# Patient Record
Sex: Female | Born: 1985 | Race: Black or African American | Hispanic: No | Marital: Single | Smoking: Never smoker
Health system: Southern US, Community
[De-identification: ages and names within clinical notes are randomized; demographics above are authoritative.]

## PROBLEM LIST (undated history)

## (undated) DIAGNOSIS — F419 Anxiety disorder, unspecified: Secondary | ICD-10-CM

## (undated) HISTORY — DX: Anxiety disorder, unspecified: F41.9

---

## 2004-11-11 ENCOUNTER — Emergency Department (HOSPITAL_COMMUNITY): Admission: EM | Admit: 2004-11-11 | Discharge: 2004-11-11 | Payer: Self-pay | Admitting: Emergency Medicine

## 2014-05-30 ENCOUNTER — Other Ambulatory Visit: Payer: Self-pay | Admitting: Occupational Medicine

## 2014-05-30 ENCOUNTER — Ambulatory Visit: Payer: Self-pay

## 2014-05-30 DIAGNOSIS — M25561 Pain in right knee: Secondary | ICD-10-CM

## 2015-03-31 DIAGNOSIS — H5213 Myopia, bilateral: Secondary | ICD-10-CM | POA: Diagnosis not present

## 2016-06-14 DIAGNOSIS — H5213 Myopia, bilateral: Secondary | ICD-10-CM | POA: Diagnosis not present

## 2016-10-31 ENCOUNTER — Ambulatory Visit (INDEPENDENT_AMBULATORY_CARE_PROVIDER_SITE_OTHER): Payer: 59 | Admitting: Physician Assistant

## 2016-10-31 ENCOUNTER — Encounter: Payer: Self-pay | Admitting: Physician Assistant

## 2016-10-31 VITALS — BP 125/79 | HR 86 | Temp 99.0°F | Resp 16 | Ht 63.0 in | Wt 270.6 lb

## 2016-10-31 DIAGNOSIS — F418 Other specified anxiety disorders: Secondary | ICD-10-CM

## 2016-10-31 MED ORDER — HYDROXYZINE HCL 50 MG PO TABS: 50.0000 mg | ORAL_TABLET | Freq: Four times a day (QID) | ORAL | 1 refills | Status: DC | PRN
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
## 2016-10-31 NOTE — Patient Instructions (Addendum)
Hydroxyzine: Anxiety: Oral: Take 50 - 100 mg 4 times daily. Be sure to take a dose 30-60 minutes before bedtime.  Continue with your deep breathing techniques.  The "Calm" app has some great meditation, relaxation and breathing exercises.  Come back in 2-3 weeks if you are not experiencing improvement. We will start a different medication.   Thank you for coming in today. I hope you feel we met your needs.  Feel free to call PCP if you have any questions or further requests.  Please consider signing up for MyChart if you do not already have it, as this is a great way to communicate with me.  Best,  Whitney McVey, PA-C   How is this treated? The following therapies are usually used to treat:  Medicine. Antidepressant medicine is usually prescribed for long-term daily control.   Talk therapy (psychotherapy). Certain types of talk therapy can be helpful by providing support, education, and guidance. Options include: ? Cognitive behavioral therapy (CBT). People learn coping skills and techniques to ease their anxiety. They learn to identify unrealistic or negative thoughts and behaviors and to replace them with positive ones. ? Acceptance and commitment therapy (ACT). This treatment teaches people how to be mindful as a way to cope with unwanted thoughts and feelings. ? Biofeedback. This process trains you to manage your body's response (physiological response) through breathing techniques and relaxation methods. You will work with a therapist while machines are used to monitor your physical symptoms.  Stress management techniques. These include yoga, meditation, and exercise.  A mental health specialist can help determine which treatment is best for you. Some people see improvement with one type of therapy. However, other people require a combination of therapies. Follow these instructions at home:  Take over-the-counter and prescription medicines only as told by your health care  provider.  Try to maintain a normal routine.  Try to anticipate stressful situations and allow extra time to manage them.  Practice any stress management or self-calming techniques as taught by your health care provider.  Do not punish yourself for setbacks or for not making progress.  Try to recognize your accomplishments, even if they are small.  Keep all follow-up visits as told by your health care provider. This is important. Get help right away if:  You have serious thoughts about hurting yourself or others. If you ever feel like you may hurt yourself or others, or have thoughts about taking your own life, get help right away. You can go to your nearest emergency department or call:  Your local emergency services (911 in the [place]  A suicide crisis helpline, such as the [facility] at 231-258-7027. This is open 24 hours a day.   IF you received an x-ray today, you will receive an invoice from [facility]. Please contact [facility] at [phone] with questions or concerns regarding your invoice.   IF you received labwork today, you will receive an invoice from [facility]. Please contact [facility] at [phone] with questions or concerns regarding your invoice.   Our billing staff will not be able to assist you with questions regarding bills from these companies.  You will be contacted with the lab results as soon as they are available. The fastest way to get your results is to activate your My Chart account. Instructions are located on the last page of this paperwork. If you have not heard from [place] regarding the results in 2 weeks, please contact this office.

## 2017-04-16 ENCOUNTER — Encounter: Payer: Self-pay | Admitting: Nurse Practitioner

## 2017-04-16 ENCOUNTER — Ambulatory Visit (INDEPENDENT_AMBULATORY_CARE_PROVIDER_SITE_OTHER): Payer: No Typology Code available for payment source | Admitting: Nurse Practitioner

## 2017-04-16 VITALS — BP 130/80 | HR 76 | Temp 98.0°F | Ht 63.0 in | Wt 269.0 lb

## 2017-04-16 DIAGNOSIS — Z0001 Encounter for general adult medical examination with abnormal findings: Secondary | ICD-10-CM | POA: Diagnosis not present

## 2017-04-16 DIAGNOSIS — Z13220 Encounter for screening for lipoid disorders: Secondary | ICD-10-CM

## 2017-04-16 DIAGNOSIS — Z114 Encounter for screening for human immunodeficiency virus [HIV]: Secondary | ICD-10-CM

## 2017-04-16 DIAGNOSIS — M25461 Effusion, right knee: Secondary | ICD-10-CM

## 2017-04-16 DIAGNOSIS — Z136 Encounter for screening for cardiovascular disorders: Secondary | ICD-10-CM | POA: Diagnosis not present

## 2017-04-16 NOTE — Patient Instructions (Signed)
Please go lab for blood draw.  Bring last PAP smear report to next office visit.  Health Maintenance, Female Adopting a healthy lifestyle and getting preventive care can go a long way to promote health and wellness. Talk with your health care provider about what schedule of regular examinations is right for you. This is a good chance for you to check in with your provider about disease prevention and staying healthy. In between checkups, there are plenty of things you can do on your own. Experts have done a lot of research about which lifestyle changes and preventive measures are most likely to keep you healthy. Ask your health care provider for more information. Weight and diet Eat a healthy diet  Be sure to include plenty of vegetables, fruits, low-fat dairy products, and lean protein.  Do not eat a lot of foods high in solid fats, added sugars, or salt.  Get regular exercise. This is one of the most important things you can do for your health. ? Most adults should exercise for at least 150 minutes each week. The exercise should increase your heart rate and make you sweat (moderate-intensity exercise). ? Most adults should also do strengthening exercises at least twice a week. This is in addition to the moderate-intensity exercise.  Maintain a healthy weight  Body mass index (BMI) is a measurement that can be used to identify possible weight problems. It estimates body fat based on height and weight. Your health care provider can help determine your BMI and help you achieve or maintain a healthy weight.  For females 64 years of age and older: ? A BMI below 18.5 is considered underweight. ? A BMI of 18.5 to 24.9 is normal. ? A BMI of 25 to 29.9 is considered overweight. ? A BMI of 30 and above is considered obese.  Watch levels of cholesterol and blood lipids  You should start having your blood tested for lipids and cholesterol at 34 years of age, then have this test every 5  years.  You may need to have your cholesterol levels checked more often if: ? Your lipid or cholesterol levels are high. ? You are older than 34 years of age. ? You are at high risk for heart disease.  Cancer screening Lung Cancer  Lung cancer screening is recommended for adults 53-61 years old who are at high risk for lung cancer because of a history of smoking.  A yearly low-dose CT scan of the lungs is recommended for people who: ? Currently smoke. ? Have quit within the past 15 years. ? Have at least a 30-pack-year history of smoking. A pack year is smoking an average of one pack of cigarettes a day for 1 year.  Yearly screening should continue until it has been 15 years since you quit.  Yearly screening should stop if you develop a health problem that would prevent you from having lung cancer treatment.  Breast Cancer  Practice breast self-awareness. This means understanding how your breasts normally appear and feel.  It also means doing regular breast self-exams. Let your health care provider know about any changes, no matter how small.  If you are in your 20s or 30s, you should have a clinical breast exam (CBE) by a health care provider every 1-3 years as part of a regular health exam.  If you are 69 or older, have a CBE every year. Also consider having a breast X-ray (mammogram) every year.  If you have a family history of breast  cancer, talk to your health care provider about genetic screening.  If you are at high risk for breast cancer, talk to your health care provider about having an MRI and a mammogram every year.  Breast cancer gene (BRCA) assessment is recommended for women who have family members with BRCA-related cancers. BRCA-related cancers include: ? Breast. ? Ovarian. ? Tubal. ? Peritoneal cancers.  Results of the assessment will determine the need for genetic counseling and BRCA1 and BRCA2 testing.  Cervical Cancer Your health care provider may  recommend that you be screened regularly for cancer of the pelvic organs (ovaries, uterus, and vagina). This screening involves a pelvic examination, including checking for microscopic changes to the surface of your cervix (Pap test). You may be encouraged to have this screening done every 3 years, beginning at age 20.  For women ages 45-65, health care providers may recommend pelvic exams and Pap testing every 3 years, or they may recommend the Pap and pelvic exam, combined with testing for human papilloma virus (HPV), every 5 years. Some types of HPV increase your risk of cervical cancer. Testing for HPV may also be done on women of any age with unclear Pap test results.  Other health care providers may not recommend any screening for nonpregnant women who are considered low risk for pelvic cancer and who do not have symptoms. Ask your health care provider if a screening pelvic exam is right for you.  If you have had past treatment for cervical cancer or a condition that could lead to cancer, you need Pap tests and screening for cancer for at least 20 years after your treatment. If Pap tests have been discontinued, your risk factors (such as having a new sexual partner) need to be reassessed to determine if screening should resume. Some women have medical problems that increase the chance of getting cervical cancer. In these cases, your health care provider may recommend more frequent screening and Pap tests.  Colorectal Cancer  This type of cancer can be detected and often prevented.  Routine colorectal cancer screening usually begins at 34 years of age and continues through 34 years of age.  Your health care provider may recommend screening at an earlier age if you have risk factors for colon cancer.  Your health care provider may also recommend using home test kits to check for hidden blood in the stool.  A small camera at the end of a tube can be used to examine your colon directly  (sigmoidoscopy or colonoscopy). This is done to check for the earliest forms of colorectal cancer.  Routine screening usually begins at age 59.  Direct examination of the colon should be repeated every 5-10 years through 34 years of age. However, you may need to be screened more often if early forms of precancerous polyps or small growths are found.  Skin Cancer  Check your skin from head to toe regularly.  Tell your health care provider about any new moles or changes in moles, especially if there is a change in a mole's shape or color.  Also tell your health care provider if you have a mole that is larger than the size of a pencil eraser.  Always use sunscreen. Apply sunscreen liberally and repeatedly throughout the day.  Protect yourself by wearing long sleeves, pants, a wide-brimmed hat, and sunglasses whenever you are outside.  Heart disease, diabetes, and high blood pressure  High blood pressure causes heart disease and increases the risk of stroke. High blood pressure  is more likely to develop in: ? People who have blood pressure in the high end of the normal range (130-139/85-89 mm Hg). ? People who are overweight or obese. ? People who are African American.  If you are 38-40 years of age, have your blood pressure checked every 3-5 years. If you are 62 years of age or older, have your blood pressure checked every year. You should have your blood pressure measured twice-once when you are at a hospital or clinic, and once when you are not at a hospital or clinic. Record the average of the two measurements. To check your blood pressure when you are not at a hospital or clinic, you can use: ? An automated blood pressure machine at a pharmacy. ? A home blood pressure monitor.  If you are between 51 years and 54 years old, ask your health care provider if you should take aspirin to prevent strokes.  Have regular diabetes screenings. This involves taking a blood sample to check your  fasting blood sugar level. ? If you are at a normal weight and have a low risk for diabetes, have this test once every three years after 34 years of age. ? If you are overweight and have a high risk for diabetes, consider being tested at a younger age or more often. Preventing infection Hepatitis B  If you have a higher risk for hepatitis B, you should be screened for this virus. You are considered at high risk for hepatitis B if: ? You were born in a country where hepatitis B is common. Ask your health care provider which countries are considered high risk. ? Your parents were born in a high-risk country, and you have not been immunized against hepatitis B (hepatitis B vaccine). ? You have HIV or AIDS. ? You use needles to inject street drugs. ? You live with someone who has hepatitis B. ? You have had sex with someone who has hepatitis B. ? You get hemodialysis treatment. ? You take certain medicines for conditions, including cancer, organ transplantation, and autoimmune conditions.  Hepatitis C  Blood testing is recommended for: ? Everyone born from [birth year] through 1967. ? Anyone with known risk factors for hepatitis C.  Sexually transmitted infections (STIs)  You should be screened for sexually transmitted infections (STIs) including gonorrhea and chlamydia if: ? You are sexually active and are younger than 34 years of age. ? You are older than 34 years of age and your health care provider tells you that you are at risk for this type of infection. ? Your sexual activity has changed since you were last screened and you are at an increased risk for chlamydia or gonorrhea. Ask your health care provider if you are at risk.  If you do not have HIV, but are at risk, it may be recommended that you take a prescription medicine daily to prevent HIV infection. This is called pre-exposure prophylaxis (PrEP). You are considered at risk if: ? You are sexually active and do not regularly use condoms  or know the HIV status of your partner(s). ? You take drugs by injection. ? You are sexually active with a partner who has HIV.  Talk with your health care provider about whether you are at high risk of being infected with HIV. If you choose to begin PrEP, you should first be tested for HIV. You should then be tested every 3 months for as long as you are taking PrEP. Pregnancy  If you are premenopausal and  you may become pregnant, ask your health care provider about preconception counseling.  If you may become pregnant, take 400 to 800 micrograms (mcg) of folic acid every day.  If you want to prevent pregnancy, talk to your health care provider about birth control (contraception). Osteoporosis and menopause  Osteoporosis is a disease in which the bones lose minerals and strength with aging. This can result in serious bone fractures. Your risk for osteoporosis can be identified using a bone density scan.  If you are 23 years of age or older, or if you are at risk for osteoporosis and fractures, ask your health care provider if you should be screened.  Ask your health care provider whether you should take a calcium or vitamin D supplement to lower your risk for osteoporosis.  Menopause may have certain physical symptoms and risks.  Hormone replacement therapy may reduce some of these symptoms and risks. Talk to your health care provider about whether hormone replacement therapy is right for you. Follow these instructions at home:  Schedule regular health, dental, and eye exams.  Stay current with your immunizations.  Do not use any tobacco products including cigarettes, chewing tobacco, or electronic cigarettes.  If you are pregnant, do not drink alcohol.  If you are breastfeeding, limit how much and how often you drink alcohol.  Limit alcohol intake to no more than 1 drink per day for nonpregnant women. One drink equals 12 ounces of beer, 5 ounces of wine, or 1 ounces of hard  liquor.  Do not use street drugs.  Do not share needles.  Ask your health care provider for help if you need support or information about quitting drugs.  Tell your health care provider if you often feel depressed.  Tell your health care provider if you have ever been abused or do not feel safe at home. This information is not intended to replace advice given to you by your health care provider. Make sure you discuss any questions you have with your health care provider. Document Released: 09/16/2010 Document Revised: 08/09/2015 Document Reviewed: 12/05/2014 Elsevier Interactive Patient Education  [employer].

## 2017-04-16 NOTE — Progress Notes (Signed)
Subjective:    Patient ID: Kiara SkinnerRenee M Romero, female    DOB: 1986-03-02, 32 y.o.   MRN: [id number]  Patient presents today for complete physical   Knee Pain   Incident onset: fall on knee 1year ago. The incident occurred at work. The injury mechanism was a fall. The pain is present in the right knee. The quality of the pain is described as aching. The pain has been intermittent since onset. Pertinent negatives include no inability to bear weight, loss of motion, loss of sensation, muscle weakness, numbness or tingling. Associated symptoms comments: Swelling and instability. The symptoms are aggravated by movement. She has tried ice and rest for the symptoms. The treatment provided no relief.   Immunizations: (TDAP, Hep C screen, Pneumovax, Influenza, zoster)  Health Maintenance  Topic Date Due  . HIV Screening  08/14/1998  . Pap Smear  03/17/2017  . Tetanus Vaccine  03/18/2023  . Flu Shot  Completed   Diet: portion control.  Weight:  Wt Readings from Last 3 Encounters:  04/16/17 269 lb (122 kg)  10/31/16 270 lb 9.6 oz (122.7 kg)   Exercise:cardio/dance class daily.  Fall Risk: Fall Risk  04/16/2017 10/31/2016  Falls in the past year? Yes No  Number falls in past yr: 1 -  Injury with Fall? Yes -  Comment knee injury -   Home Safety:home with husband and child.  Depression/Suicide: [facility] 04/16/2017 10/31/2016  Decreased Interest 0 0  Down, Depressed, Hopeless 0 0  PHQ - 2 Score 0 0   Pap Smear (every 6547yrs for >21-29 without HPV, every 2380yrs for >30-31680yrs with HPV):last done 2016, unknwn provider, no hx of abnormal  Vision:up to date, [facility]  Dental:needed.  Sexual History (birth control, marital status, STD):sexually active, no contraceptive.  Medications and allergies reviewed with patient and updated if appropriate.  There are no active problems to display for this patient.   Current Outpatient Medications on File Prior to Visit    Medication Sig Dispense Refill  . hydrOXYzine (ATARAX/VISTARIL) 50 MG tablet Take 1 tablet (50 mg total) by mouth every 6 (six) hours as needed for anxiety (may increase to 100mg  q 6 hrs if needed). (Patient not taking: Reported on 04/16/2017) 60 tablet 1   No current facility-administered medications on file prior to visit.     Past Medical History:  Diagnosis Date  . Anxiety     Past Surgical History:  Procedure Laterality Date  . CESAREAN SECTION  2005    Social History   Socioeconomic History  . Marital status: Married    Spouse name: None  . Number of children: None  . Years of education: None  . Highest education level: None  Social Needs  . Financial resource strain: None  . Food insecurity - worry: None  . Food insecurity - inability: None  . Transportation needs - medical: None  . Transportation needs - non-medical: None  Occupational History  . None  Tobacco Use  . Smoking status: Never Smoker  . Smokeless tobacco: Never Used  Substance and Sexual Activity  . Alcohol use: No  . Drug use: No  . Sexual activity: None  Other Topics Concern  . None  Social History Narrative  . None    Family History  Problem Relation Age of Onset  . Diabetes Mother   . Hyperlipidemia Mother   . Hypertension Mother   . Depression Mother   . Hyperlipidemia Father   .  Heart disease Father   . Drug abuse Father   . Depression Brother   . Hypertension Brother   . Anxiety disorder Brother         Review of Systems  Constitutional: Negative for fever, malaise/fatigue and weight loss.  HENT: Negative for congestion and sore throat.   Eyes:       Negative for visual changes  Respiratory: Negative for cough and shortness of breath.   Cardiovascular: Negative for chest pain, palpitations and leg swelling.  Gastrointestinal: Negative for blood in stool, constipation, diarrhea and heartburn.  Genitourinary: Negative for dysuria, frequency and urgency.  Musculoskeletal:  Positive for joint pain. Negative for falls and myalgias.  Skin: Negative for rash.  Neurological: Negative for dizziness, tingling, sensory change, numbness and headaches.  Endo/Heme/Allergies: Does not bruise/bleed easily.  Psychiatric/Behavioral: Negative for depression, substance abuse and suicidal ideas. The patient is not nervous/anxious.     Objective:   Vitals:   04/16/17 1538  BP: 130/80  Pulse: 76  Temp: 98 F (36.7 C)  SpO2: 98%    Body mass index is 47.65 kg/m.   Physical Examination:  Physical Exam  Constitutional: She is oriented to person, place, and time and well-developed, well-nourished, and in no distress. No distress.  HENT:  Right Ear: External ear normal.  Left Ear: External ear normal.  Nose: Nose normal.  Mouth/Throat: Oropharynx is clear and moist. No oropharyngeal exudate.  Eyes: Conjunctivae and EOM are normal. Pupils are equal, round, and reactive to light. No scleral icterus.  Neck: Normal range of motion. Neck supple. No thyromegaly present.  Cardiovascular: Normal rate, normal heart sounds and intact distal pulses.  Pulmonary/Chest: Effort normal and breath sounds normal. She exhibits no tenderness. Right breast exhibits no inverted nipple, no mass, no nipple discharge, no skin change and no tenderness. Left breast exhibits no inverted nipple, no mass, no nipple discharge, no skin change and no tenderness. Breasts are symmetrical.  Abdominal: Soft. Bowel sounds are normal. She exhibits no distension. There is no tenderness.  Genitourinary:  Genitourinary Comments: Deferred by patient  Musculoskeletal: Normal range of motion. She exhibits no edema or tenderness.       Right knee: She exhibits effusion. She exhibits normal range of motion and no erythema. No tenderness found.       Left knee: Normal.  Right knee crepitus  Lymphadenopathy:    She has no cervical adenopathy.  Neurological: She is alert and oriented to person, place, and time.  Gait normal.  Skin: Skin is warm and dry.  Psychiatric: Affect and judgment normal.    ASSESSMENT and PLAN:  Kiara Romero was seen today for establish care.  Diagnoses and all orders for this visit:  Encounter for preventative adult health care exam with abnormal findings -     Cancel: CBC -     Cancel: Comprehensive metabolic panel -     Cancel: Lipid panel -     Cancel: TSH -     Cancel: HIV antibody -     Comprehensive metabolic panel; Future -     Lipid panel; Future -     TSH; Future  Encounter for lipid screening for cardiovascular disease -     Cancel: Lipid panel -     CBC; Future  Encounter for screening for human immunodeficiency virus (HIV) -     Cancel: HIV antibody -     HIV antibody; Future  Knee effusion, right -     Ambulatory referral to Sports Medicine  No problem-specific Assessment & Plan notes found for this encounter.     Follow up: Return if symptoms worsen or fail to improve.  Alysia Penna, NP

## 2017-04-17 ENCOUNTER — Encounter: Payer: Self-pay | Admitting: Nurse Practitioner

## 2017-04-24 ENCOUNTER — Ambulatory Visit: Payer: No Typology Code available for payment source | Admitting: Family Medicine

## 2017-05-15 ENCOUNTER — Ambulatory Visit (INDEPENDENT_AMBULATORY_CARE_PROVIDER_SITE_OTHER): Payer: No Typology Code available for payment source | Admitting: Family Medicine

## 2017-05-15 ENCOUNTER — Encounter: Payer: Self-pay | Admitting: Family Medicine

## 2017-05-15 VITALS — BP 134/76 | HR 83 | Temp 98.5°F | Ht 63.0 in | Wt 263.0 lb

## 2017-05-15 DIAGNOSIS — M25561 Pain in right knee: Secondary | ICD-10-CM | POA: Diagnosis not present

## 2017-05-15 MED ORDER — DICLOFENAC SODIUM 2 % TD SOLN: 1.0000 "application " | Freq: Two times a day (BID) | TRANSDERMAL | 3 refills | Status: DC

## 2017-05-15 NOTE — Patient Instructions (Addendum)
Please try to wear compression Please try to ice as needed.  Please try the exercises I've provided.  Please try riding a bike for exercise Please follow up with me in 4-6 weeks if there is no improvement.

## 2017-05-15 NOTE — Progress Notes (Signed)
Kiara SkinnerRenee M Warren - 32 y.o. female MRN [id number]  Date of birth: [birth year]  SUBJECTIVE:  Including CC & ROS.  Chief Complaint  Patient presents with  . Right knee pain    Kiara SkinnerRenee M Warren is a 32 y.o. female that is presenting with right knee pain. Pain has been increasing over the past month. She sustained a fall at work and landed on her knee last year. Admits to swelling and tenderness. Pain located anteriorly.Denies tingling or numbness. Pain is a burning ache. Flexion and extension increase the pain. She has been taking motrin and applying icy hot. She has been increasing her exercising daily. Pain is anterior and localized. Pain is worse with prolonged sitting and having to rise from a seated position.       Review of Systems  Constitutional: Negative for fever.  HENT: Negative for sore throat.   Respiratory: Negative for cough.   Cardiovascular: Negative for chest pain.  Gastrointestinal: Negative for abdominal pain.  Musculoskeletal: Negative for gait problem.  Skin: Negative for color change.  Allergic/Immunologic: Negative for immunocompromised state.  Neurological: Negative for weakness.  Hematological: Negative for adenopathy.  Psychiatric/Behavioral: Negative for agitation.    HISTORY: Past Medical, Surgical, Social, and Family History Reviewed & Updated per EMR.   Pertinent Historical Findings include:  Past Medical History:  Diagnosis Date  . Anxiety     Past Surgical History:  Procedure Laterality Date  . CESAREAN SECTION  2005    No Known Allergies  Family History  Problem Relation Age of Onset  . Diabetes Mother   . Hyperlipidemia Mother   . Hypertension Mother   . Depression Mother   . Hyperlipidemia Father   . Heart disease Father   . Drug abuse Father   . Depression Brother   . Hypertension Brother   . Anxiety disorder Brother      Social History   Socioeconomic History  . Marital status: Single    Spouse name: Not on file  . Number of  children: Not on file  . Years of education: Not on file  . Highest education level: Not on file  Social Needs  . Financial resource strain: Not on file  . Food insecurity - worry: Not on file  . Food insecurity - inability: Not on file  . Transportation needs - medical: Not on file  . Transportation needs - non-medical: Not on file  Occupational History  . Not on file  Tobacco Use  . Smoking status: Never Smoker  . Smokeless tobacco: Never Used  Substance and Sexual Activity  . Alcohol use: No  . Drug use: No  . Sexual activity: Not on file  Other Topics Concern  . Not on file  Social History Narrative  . Not on file     PHYSICAL EXAM:  VS: BP 134/76 (BP Location: Left Arm, Patient Position: Sitting, Cuff Size: Normal)   Pulse 83   Temp 98.5 F (36.9 C) (Oral)   Ht 5\' 3"  (1.6 m)   Wt 263 lb (119.3 kg)   SpO2 98%   BMI 46.59 kg/m  Physical Exam Gen: NAD, alert, cooperative with exam, well-appearing ENT: normal lips, normal nasal mucosa,  Eye: normal EOM, normal conjunctiva and lids CV:  no edema, +2 pedal pulses   Resp: no accessory muscle use, non-labored,  GI: no masses or tenderness, no hernia  Skin: no rashes, no areas of induration  Neuro: normal tone, normal sensation to touch Psych:  normal insight, alert and  oriented MSK:  No effusion  No TTP of the medial or lateral joint line.  Normal ROM  Normal strength to resistance in flexion and extension  No instability with valgus and varus testing  Normal Thessaly test  Good strength with hip abduction Neurovascularly intact   Limited ultrasound: right knee:  No effusion in the SPP  Normal QT and PT in long axis Normal medial joint line  Normal lateral joint line  Normal joint space in the sunrise view  Summary: normal exam    Ultrasound and interpretation by Clare Gandy, MD       ASSESSMENT & PLAN:   Acute pain of right knee Likely related to patellofemoral pain. No suggestion of  meniscal injury. Possible for fat pad impingement as she states pain with complete extension.  - Pennsaid  - Counseled on HEP  - counseled on ice and compression  - if no improvement would xray and consider PT.

## 2017-05-15 NOTE — Assessment & Plan Note (Signed)
Likely related to patellofemoral pain. No suggestion of meniscal injury. Possible for fat pad impingement as she states pain with complete extension.  - Pennsaid  - Counseled on HEP  - counseled on ice and compression  - if no improvement would xray and consider PT.

## 2017-09-08 ENCOUNTER — Telehealth: Payer: No Typology Code available for payment source | Admitting: Family

## 2017-09-08 DIAGNOSIS — R197 Diarrhea, unspecified: Secondary | ICD-10-CM

## 2017-09-08 NOTE — Progress Notes (Signed)
Based on what you shared with me it looks like you have a condition that should be evaluated in a face to face office visit.  NOTE: If you entered your credit card information for this eVisit, you will not be charged. You may see a "hold" on your card for the $30 but that hold will drop off and you will not have a charge processed.  If you are having a true medical emergency please call 911.  If you need an urgent face to face visit, [place] has four urgent care centers for your convenience.  If you need care fast and have a high deductible or no insurance consider:   [email or web address] to reserve your spot online an avoid wait times  [facility] [address], [address] [place], [place] [address] 8 am to 8 pm Monday-Friday 10 am to 4 pm Saturday-Sunday *Across the street from [employer]  [facility]  [address] [place], [address] 8 am to 5 pm Monday-Friday * In the [facility]   The following sites will take your  insurance:  . [facility]  [phone] Get Driving Directions Find a Provider at this Location  [address] [place], [place] [address] . 10 am to 8 pm Monday-Friday . 12 pm to 8 pm Saturday-Sunday   . [facility]  [phone] Get Driving Directions Find a Provider at this Location  [address], [address] [place], [place] [address] . 8 am to 8 pm Monday-Friday . 9 am to 6 pm Saturday . 11 am to 6 pm Sunday   . [facility] [place]  [phone] Get Driving Directions  [address] [place], [place] [address] . 8 am to 8 pm Monday-Friday . 8 am to 4 pm Saturday-Sunday   Your e-visit answers were reviewed by a board certified advanced clinical practitioner to complete your personal care plan.  Thank you for using e-Visits. 

## 2018-01-14 ENCOUNTER — Encounter: Payer: Self-pay | Admitting: Nurse Practitioner

## 2018-03-24 ENCOUNTER — Telehealth: Payer: No Typology Code available for payment source | Admitting: Nurse Practitioner

## 2018-03-24 DIAGNOSIS — J069 Acute upper respiratory infection, unspecified: Secondary | ICD-10-CM

## 2018-03-24 MED ORDER — BENZONATATE 100 MG PO CAPS: 100.0000 mg | ORAL_CAPSULE | Freq: Three times a day (TID) | ORAL | 0 refills | Status: DC | PRN

## 2018-03-24 MED ORDER — AZITHROMYCIN 250 MG PO TABS: ORAL_TABLET | ORAL | 0 refills | Status: DC

## 2018-03-24 NOTE — Progress Notes (Signed)

## 2018-04-16 ENCOUNTER — Telehealth: Payer: No Typology Code available for payment source | Admitting: Nurse Practitioner

## 2018-04-16 DIAGNOSIS — J069 Acute upper respiratory infection, unspecified: Secondary | ICD-10-CM | POA: Diagnosis not present

## 2018-04-16 MED ORDER — BENZONATATE 100 MG PO CAPS: 100.0000 mg | ORAL_CAPSULE | Freq: Three times a day (TID) | ORAL | 0 refills | Status: DC | PRN

## 2018-04-16 MED ORDER — FLUTICASONE PROPIONATE 50 MCG/ACT NA SUSP: 2.0000 | Freq: Every day | NASAL | 6 refills | Status: DC

## 2018-04-16 NOTE — Progress Notes (Signed)
We are sorry you are not feeling well.  Here is how we plan to help!  Based on what you have shared with me, it looks like you may have a viral upper respiratory infection or a "common cold".  Colds are caused by a large number of viruses; however, rhinovirus is the most common cause.   Symptoms of the common cold vary from person to person, with common symptoms including sore throat, cough, and malaise.  A low-grade fever of 100.4 may present, but is often uncommon.  Symptoms vary however, and are closely related to a person's age or underlying illnesses.  The most common symptoms associated with the common cold are nasal discharge or congestion, cough, sneezing, headache and pressure in the ears and face.  Cold symptoms usually persist for about 3 to 10 days, but can last up to 2 weeks.  It is important to know that colds do not cause serious illness or complications in most cases.    The common cold is transmitted from person to person, with the most common method of transmission being a person's hands.  The virus is able to live on the skin and can infect other persons for up to 2 hours after direct contact.  Also, colds are transmitted when someone coughs or sneezes; thus, it is important to cover the mouth to reduce this risk.  To keep the spread of the common cold at bay, good hand hygiene is very important.  This is an infection that is most likely caused by a virus. There are no specific treatments for the common cold other than to help you with the symptoms until the infection runs its course.    For nasal congestion, you may use an oral decongestants such as Mucinex D or if you have glaucoma or high blood pressure use plain Mucinex.  Saline nasal spray or nasal drops can help and can safely be used as often as needed for congestion.  For your congestion, I have prescribed Fluticasone nasal spray one spray in each nostril twice a day  If you do not have a history of heart disease, hypertension,  diabetes or thyroid disease, prostate/bladder issues or glaucoma, you may also use Sudafed to treat nasal congestion.  It is highly recommended that you consult with a pharmacist or your primary care physician to ensure this medication is safe for you to take.     If you have a cough, you may use cough suppressants such as Delsym and Robitussin.  If you have glaucoma or high blood pressure, you can also use Coricidin HBP.   For cough I have prescribed for you A prescription cough medication called Tessalon Perles 100 mg. You may take 1-2 capsules every 8 hours as needed for cough  If you have a sore or scratchy throat, use a saltwater gargle-  to  teaspoon of salt dissolved in a 4-ounce to 8-ounce glass of warm water.  Gargle the solution for approximately 15-30 seconds and then spit.  It is important not to swallow the solution.  You can also use throat lozenges/cough drops and Chloraseptic spray to help with throat pain or discomfort.  Warm or cold liquids can also be helpful in relieving throat pain.  For headache, pain or general discomfort, you can use Ibuprofen or Tylenol as directed.   Some authorities believe that zinc sprays or the use of Echinacea may shorten the course of your symptoms.   HOME CARE . Only take medications as instructed by your   medical team. . Be sure to drink plenty of fluids. Water is fine as well as fruit juices, sodas and electrolyte beverages. You may want to stay away from caffeine or alcohol. If you are nauseated, try taking small sips of liquids. How do you know if you are getting enough fluid? Your urine should be a pale yellow or almost colorless. . Get rest. . Taking a steamy shower or using a humidifier may help nasal congestion and ease sore throat pain. You can place a towel over your head and breathe in the steam from hot water coming from a faucet. . Using a saline nasal spray works much the same way. . Cough drops, hard candies and sore throat lozenges  may ease your cough. . Avoid close contacts especially the very young and the elderly . Cover your mouth if you cough or sneeze . Always remember to wash your hands.   GET HELP RIGHT AWAY IF: . You develop worsening fever. . If your symptoms do not improve within 10 days . You develop yellow or green discharge from your nose over 3 days. . You have coughing fits . You develop a severe head ache or visual changes. . You develop shortness of breath, difficulty breathing or start having chest pain . Your symptoms persist after you have completed your treatment plan  MAKE SURE YOU   Understand these instructions.  Will watch your condition.  Will get help right away if you are not doing well or get worse.  Your e-visit answers were reviewed by a board certified advanced clinical practitioner to complete your personal care plan. Depending upon the condition, your plan could have included both over the counter or prescription medications. Please review your pharmacy choice. If there is a problem, you may call our nursing hot line at and have the prescription routed to another pharmacy. Your safety is important to us. If you have drug allergies check your prescription carefully.   You can use MyChart to ask questions about today's visit, request a non-urgent call back, or ask for a work or school excuse for 24 hours related to this e-Visit. If it has been greater than 24 hours you will need to follow up with your provider, or enter a new e-Visit to address those concerns. You will get an e-mail in the next two days asking about your experience.  I hope that your e-visit has been valuable and will speed your recovery. Thank you for using e-visits.      

## 2018-04-28 ENCOUNTER — Ambulatory Visit (INDEPENDENT_AMBULATORY_CARE_PROVIDER_SITE_OTHER): Payer: No Typology Code available for payment source | Admitting: Nurse Practitioner

## 2018-04-28 ENCOUNTER — Encounter: Payer: Self-pay | Admitting: Nurse Practitioner

## 2018-04-28 ENCOUNTER — Ambulatory Visit (INDEPENDENT_AMBULATORY_CARE_PROVIDER_SITE_OTHER): Payer: No Typology Code available for payment source

## 2018-04-28 VITALS — BP 134/84 | HR 94 | Temp 98.5°F | Ht 63.0 in | Wt 256.0 lb

## 2018-04-28 DIAGNOSIS — R05 Cough: Secondary | ICD-10-CM

## 2018-04-28 DIAGNOSIS — Z7251 High risk heterosexual behavior: Secondary | ICD-10-CM | POA: Diagnosis not present

## 2018-04-28 DIAGNOSIS — R059 Cough, unspecified: Secondary | ICD-10-CM

## 2018-04-28 DIAGNOSIS — R0602 Shortness of breath: Secondary | ICD-10-CM

## 2018-04-28 LAB — POCT URINE PREGNANCY: Preg Test, Ur: NEGATIVE

## 2018-04-28 MED ORDER — CETIRIZINE HCL 10 MG PO TABS: 10.0000 mg | ORAL_TABLET | Freq: Every day | ORAL | 0 refills | Status: DC

## 2018-04-28 MED ORDER — FLUTICASONE PROPIONATE 50 MCG/ACT NA SUSP: 2.0000 | Freq: Every day | NASAL | 6 refills | Status: DC

## 2018-04-28 MED ORDER — OMEPRAZOLE 20 MG PO CPDR: 20.0000 mg | DELAYED_RELEASE_CAPSULE | Freq: Every day | ORAL | 0 refills | Status: DC

## 2018-04-28 NOTE — Patient Instructions (Addendum)
Normal CXR. Start flonase, omeprazole and zyrtec

## 2018-04-28 NOTE — Progress Notes (Signed)
Subjective:  Patient ID: Kiara Romero, female    DOB: [birth year]  Age: 33 y.o. MRN: [id number]  CC: Cough (pt is c/o of coughing,SOB,tight in chest/ going on 1 wk ago/took otc cold med,mucinex DM,allergy med/ FYI--has E vitis 3 wks about--got abx and cough med. )  Cough  This is a new problem. The current episode started 1 to 4 weeks ago. The problem has been waxing and waning. The cough is non-productive. Associated symptoms include nasal congestion, postnasal drip, rhinorrhea, a sore throat and shortness of breath. Pertinent negatives include no chest pain, chills, fever, sweats, weight loss or wheezing. The symptoms are aggravated by cold air and lying down. She has tried a beta-agonist inhaler, OTC cough suppressant and prescription cough suppressant for the symptoms. The treatment provided mild relief. Her past medical history is significant for bronchitis.    Reviewed past Medical, Social and Family history today.  Outpatient Medications Prior to Visit  Medication Sig Dispense Refill  . azithromycin (ZITHROMAX Z-PAK) 250 MG tablet As directed (Patient not taking: Reported on 04/28/2018) 6 tablet 0  . benzonatate (TESSALON PERLES) 100 MG capsule Take 1 capsule (100 mg total) by mouth 3 (three) times daily as needed for cough. (Patient not taking: Reported on 04/28/2018) 20 capsule 0  . Diclofenac Sodium (PENNSAID) 2 % SOLN Place 1 application onto the skin 2 (two) times daily. (Patient not taking: Reported on 04/28/2018) 1 Bottle 3  . fluticasone (FLONASE) 50 MCG/ACT nasal spray Place 2 sprays into both nostrils daily. (Patient not taking: Reported on 04/28/2018) 16 g 6   No facility-administered medications prior to visit.     ROS See HPI  Objective:  BP 134/84   Pulse 94   Temp 98.5 F (36.9 C) (Oral)   Ht 5\' 3"  (1.6 m)   Wt 256 lb (116.1 kg)   SpO2 99%   BMI 45.35 kg/m   BP Readings from Last 3 Encounters:  04/28/18 134/84  05/15/17 134/76  04/16/17 130/80    Wt  Readings from Last 3 Encounters:  04/28/18 256 lb (116.1 kg)  05/15/17 263 lb (119.3 kg)  04/16/17 269 lb (122 kg)    Physical Exam Vitals signs reviewed.  Constitutional:      General: She is not in acute distress. HENT:     Right Ear: Tympanic membrane, ear canal and external ear normal.     Left Ear: Tympanic membrane, ear canal and external ear normal.     Mouth/Throat:     Mouth: Mucous membranes are moist.     Pharynx: Posterior oropharyngeal erythema present.  Cardiovascular:     Rate and Rhythm: Normal rate and regular rhythm.  Pulmonary:     Effort: Pulmonary effort is normal.     Breath sounds: Normal breath sounds.  Musculoskeletal:     Right lower leg: No edema.     Left lower leg: No edema.  Lymphadenopathy:     Cervical: No cervical adenopathy.  Neurological:     Mental Status: She is alert and oriented to person, place, and time.     No results found for: WBC, HGB, HCT, PLT, GLUCOSE, CHOL, TRIG, HDL, LDLDIRECT, LDLCALC, ALT, AST, NA, K, CL, CREATININE, BUN, CO2, TSH, PSA, INR, GLUF, HGBA1C, MICROALBUR  Assessment & Plan:   Luster LandsbergRenee was seen today for cough.  Diagnoses and all orders for this visit:  Cough -     DG Chest 2 View -     fluticasone (FLONASE) 50 MCG/ACT nasal  spray; Place 2 sprays into both nostrils daily. -     cetirizine (ZYRTEC) 10 MG tablet; Take 1 tablet (10 mg total) by mouth at bedtime. -     omeprazole (PRILOSEC) 20 MG capsule; Take 1 capsule (20 mg total) by mouth daily.  Unprotected sexual intercourse -     POCT urine pregnancy  SOB (shortness of breath) on exertion -     DG Chest 2 View   I am having Darriana M. Romero start on cetirizine and omeprazole. I am also having her maintain her Diclofenac Sodium, azithromycin, benzonatate, and fluticasone.  Meds ordered this encounter  Medications  . fluticasone (FLONASE) 50 MCG/ACT nasal spray    Sig: Place 2 sprays into both nostrils daily.    Dispense:  16 g    Refill:  6     Order Specific Question:   Supervising Provider    Answer:   Dianne Dun [[id number]]  . cetirizine (ZYRTEC) 10 MG tablet    Sig: Take 1 tablet (10 mg total) by mouth at bedtime.    Dispense:  14 tablet    Refill:  0    Order Specific Question:   Supervising Provider    Answer:   Dianne Dun [[id number]]  . omeprazole (PRILOSEC) 20 MG capsule    Sig: Take 1 capsule (20 mg total) by mouth daily.    Dispense:  14 capsule    Refill:  0    Order Specific Question:   Supervising Provider    Answer:   Dianne Dun [[id number]]    Problem List Items Addressed This Visit    None    Visit Diagnoses    Cough    -  Primary   Relevant Medications   fluticasone (FLONASE) 50 MCG/ACT nasal spray   cetirizine (ZYRTEC) 10 MG tablet   omeprazole (PRILOSEC) 20 MG capsule   Other Relevant Orders   DG Chest 2 View (Completed)   Unprotected sexual intercourse       Relevant Orders   POCT urine pregnancy (Completed)   SOB (shortness of breath) on exertion       Relevant Orders   DG Chest 2 View (Completed)       Follow-up: No follow-ups on file.  Alysia Penna, NP

## 2018-04-30 ENCOUNTER — Encounter: Payer: Self-pay | Admitting: Nurse Practitioner

## 2018-08-11 ENCOUNTER — Encounter: Payer: Self-pay | Admitting: Obstetrics and Gynecology

## 2018-08-11 ENCOUNTER — Ambulatory Visit (INDEPENDENT_AMBULATORY_CARE_PROVIDER_SITE_OTHER): Payer: No Typology Code available for payment source | Admitting: Obstetrics and Gynecology

## 2018-08-11 ENCOUNTER — Other Ambulatory Visit: Payer: Self-pay

## 2018-08-11 VITALS — BP 140/67 | HR 96 | Wt 259.0 lb

## 2018-08-11 DIAGNOSIS — Z3A12 12 weeks gestation of pregnancy: Secondary | ICD-10-CM

## 2018-08-11 DIAGNOSIS — O99210 Obesity complicating pregnancy, unspecified trimester: Secondary | ICD-10-CM | POA: Insufficient documentation

## 2018-08-11 DIAGNOSIS — Z1151 Encounter for screening for human papillomavirus (HPV): Secondary | ICD-10-CM | POA: Diagnosis not present

## 2018-08-11 DIAGNOSIS — Z124 Encounter for screening for malignant neoplasm of cervix: Secondary | ICD-10-CM | POA: Diagnosis not present

## 2018-08-11 DIAGNOSIS — Z113 Encounter for screening for infections with a predominantly sexual mode of transmission: Secondary | ICD-10-CM | POA: Diagnosis not present

## 2018-08-11 DIAGNOSIS — Z3480 Encounter for supervision of other normal pregnancy, unspecified trimester: Secondary | ICD-10-CM

## 2018-08-11 DIAGNOSIS — O99211 Obesity complicating pregnancy, first trimester: Secondary | ICD-10-CM

## 2018-08-11 DIAGNOSIS — Z98891 History of uterine scar from previous surgery: Secondary | ICD-10-CM

## 2018-08-11 DIAGNOSIS — R030 Elevated blood-pressure reading, without diagnosis of hypertension: Secondary | ICD-10-CM | POA: Insufficient documentation

## 2018-08-11 MED ORDER — PRENATAL MULTIVITAMIN CH: 1.0000 | ORAL_TABLET | Freq: Every day | ORAL | Status: DC

## 2018-08-11 MED ORDER — ASPIRIN EC 81 MG PO TBEC: 81.0000 mg | DELAYED_RELEASE_TABLET | Freq: Every day | ORAL | 2 refills | Status: DC

## 2018-08-11 NOTE — Progress Notes (Signed)
INITIAL PRENATAL VISIT NOTE  Subjective:  Kiara Romero is a 33 y.o. G2P1001 at [redacted]w[redacted]d by LMP c/w [place] today being seen today for her initial prenatal visit. This is an unplanned pregnancy. She and partner are happy with the pregnancy. She was using nothing for birth control previously. She has an obstetric history significant for 1 x CS, patient believes she got to 10 cm and tried to push, and then had an emergency c-section because of either her HR or fetal HR. She has a medical history significant for n/a.  Patient reports occasional dizziness.   . Vag. Bleeding: None.  Movement: Absent. Denies leaking of fluid.    Past Medical History:  Diagnosis Date  . Anxiety     Past Surgical History:  Procedure Laterality Date  . CESAREAN SECTION  2005    OB History  Gravida Para Term Preterm AB Living  2 1 1     1   SAB TAB Ectopic Multiple Live Births               # Outcome Date GA Lbr Len/2nd Weight Sex Delivery Anes PTL Lv  2 Current           1 Term 04/24/03 [redacted]w[redacted]d   F CS-LTranv       Social History   Socioeconomic History  . Marital status: Single    Spouse name: Not on file  . Number of children: Not on file  . Years of education: Not on file  . Highest education level: Not on file  Occupational History  . Not on file  Social Needs  . Financial resource strain: Not on file  . Food insecurity:    Worry: Not on file    Inability: Not on file  . Transportation needs:    Medical: Not on file    Non-medical: Not on file  Tobacco Use  . Smoking status: Never Smoker  . Smokeless tobacco: Never Used  Substance and Sexual Activity  . Alcohol use: No  . Drug use: No  . Sexual activity: Not on file  Lifestyle  . Physical activity:    Days per week: Not on file    Minutes per session: Not on file  . Stress: Not on file  Relationships  . Social connections:    Talks on phone: Not on file    Gets together: Not on file    Attends religious service: Not on file   Active member of club or organization: Not on file    Attends meetings of clubs or organizations: Not on file    Relationship status: Not on file  Other Topics Concern  . Not on file  Social History Narrative  . Not on file    Family History  Problem Relation Age of Onset  . Diabetes Mother   . Hyperlipidemia Mother   . Hypertension Mother   . Depression Mother   . Hyperlipidemia Father   . Heart disease Father   . Drug abuse Father   . Depression Brother   . Hypertension Brother   . Anxiety disorder Brother      Current Outpatient Medications:  .  Prenatal MV-Min-FA-Omega-3 (PRENATAL GUMMIES/DHA & FA) 0.4-32.5 MG CHEW, Chew by mouth., Disp: , Rfl:  .  aspirin EC 81 MG tablet, Take 1 tablet (81 mg total) by mouth daily. Take after 12 weeks for prevention of preeclampsia later in pregnancy, Disp: 300 tablet, Rfl: 2 .  cetirizine (ZYRTEC) 10 MG tablet, Take 1  tablet (10 mg total) by mouth at bedtime. (Patient not taking: Reported on 08/11/2018), Disp: 14 tablet, Rfl: 0 .  fluticasone (FLONASE) 50 MCG/ACT nasal spray, Place 2 sprays into both nostrils daily. (Patient not taking: Reported on 08/11/2018), Disp: 16 g, Rfl: 6 .  omeprazole (PRILOSEC) 20 MG capsule, Take 1 capsule (20 mg total) by mouth daily. (Patient not taking: Reported on 08/11/2018), Disp: 14 capsule, Rfl: 0  Current Facility-Administered Medications:  .  [START ON 08/12/2018] prenatal multivitamin tablet 1 tablet, 1 tablet, Oral, Q1200, Conan Bowensavis, Janann Boeve M, MD  No Known Allergies  Review of Systems: Negative except for what is mentioned in HPI.  Objective:   Vitals:   08/11/18 1418  BP: 140/67  Pulse: 96  Weight: 259 lb (117.5 kg)    Fetal Status: Fetal Heart Rate (bpm): 164   Movement: Absent     Physical Exam: BP 140/67   Pulse 96   Wt 259 lb (117.5 kg)   LMP 05/18/2018   BMI 45.88 kg/m  CONSTITUTIONAL: Well-developed, well-nourished female in no acute distress.  NEUROLOGIC: Alert and oriented to  person, place, and time. Normal reflexes, muscle tone coordination. No cranial nerve deficit noted. PSYCHIATRIC: Normal mood and affect. Normal behavior. Normal judgment and thought content. SKIN: Skin is warm and dry. No rash noted. Not diaphoretic. No erythema. No pallor. HENT:  Normocephalic, atraumatic, External right and left ear normal. Oropharynx is clear and moist EYES: Conjunctivae and EOM are normal. Pupils are equal, round, and reactive to light. No scleral icterus.  NECK: Normal range of motion, supple, no masses CARDIOVASCULAR: Normal heart rate noted, regular rhythm RESPIRATORY: Effort and breath sounds normal, no problems with respiration noted BREASTS: symmetric, non-tender, no masses palpable ABDOMEN: Soft, nontender, nondistended, gravid. Well healed pfannenstiel GU: normal appearing external female genitalia, nulliparous normal appearing cervix, scant white discharge in vagina, no lesions noted Bimanual: 12 weeks sized uterus, no adnexal tenderness or palpable lesions noted MUSCULOSKELETAL: Normal range of motion. EXT:  No edema and no tenderness. 2+ distal pulses.  Assessment and Plan:  Pregnancy: G2P1001 at 3237w1d by LMP c/w US today  1. Supervision of other normal pregnancy, antepartum [facility]ed [facility] practice structure, multiple providers, fellows, medical students, virtual visits, MyChart.  - Urine Culture - Cytology - PAP( [place]) - Obstetric Panel, Including HIV - Enroll Patient in Babyscripts - Genetic Screening - US MFM OB 11-14 WEEK ANATOMY; Future  2. History of C-section Signed release of records to get op note today  3. Elevated blood pressure reading without diagnosis of hypertension States she is nervous, review of last several PCP visits shows BP of 130s/70-80s - reviewed she may have hypertension, will keep an eye on it - gave BP cuff in office today  4. Obesity affecting pregnancy in first trimester - HgB A1c - TSH  - to start baby ASA, sent to pharmcy   Preterm labor symptoms and general obstetric precautions including but not limited to vaginal bleeding, contractions, leaking of fluid and fetal movement were reviewed in detail with the patient.  Please refer to After Visit Summary for other counseling recommendations.   Return in about 4 weeks (around 09/08/2018) for OB visit (MD), virtual.  Conan BowensKelly M Kaydense Rizo 08/11/2018 3:14 PM

## 2018-08-11 NOTE — Progress Notes (Signed)
DATING AND VIABILITY SONOGRAM   Kiara Romero is a 33 y.o. year old G2P1001 with LMP Patient's last menstrual period was 05/18/2018. which would correlate to  [redacted]w[redacted]d weeks gestation.  She has regular menstrual cycles.   She is here today for a confirmatory initial sonogram.    GESTATION: [place]     FETAL ACTIVITY:          Heart rate         164          The fetus is active.     GESTATIONAL AGE AND  BIOMETRICS:  Gestational criteria: Estimated Date of Delivery: 02/22/19 by LMP now at [redacted]w[redacted]d  Previous Scans:0      CROWN RUMP LENGTH           4.72 cm     11-3 weeks                                                                               AVERAGE EGA(BY THIS SCAN): 11-3 weeks  WORKING EDD( LMP ):  02/22/2019     TECHNICIAN COMMENTS: Patient informed that the ultrasound is considered a limited obstetric ultrasound and is not intended to be a complete ultrasound exam. Patient also informed that the ultrasound is not being completed with the intent of assessing for fetal or placental anomalies or any pelvic abnormalities. Explained that the purpose of today's ultrasound is to assess for fetal heart rate. Patient acknowledges the purpose of the exam and the limitations of the study.    Armandina Stammer 08/11/2018 3:25 PM

## 2018-08-11 NOTE — Patient Instructions (Signed)
HOW TO TAKE YOUR BLOOD PRESSURE AT HOME ° °Take your blood pressure at home. Pick one day a week and take your blood pressure consistently every week on this day. Relax quietly for about 15 minutes then take your blood pressure. DO NOT take it when you are upset, stressed, or have just been active. Make sure your blood pressure cuff fits appropriately, there should be measurements on the box and on the cuff to help guide you.  ° °Once you have taken your blood pressure, look at the numbers. If the top number (systolic) is equal to or above 140, please call the office and let us know. If the bottom number (diastolic) is equal to or above 90, please call the office and let us know.  ° °If you have any questions or are concerned you are not taking your blood pressure correctly, please call the office! ° °If you have a headache that does not improve, problems with your vision, abdominal pain or other concerns, please call and let us know. Please go to the hospital with any severe symptoms. Please go to the hospital for labor, painful contractions every 5 minutes or less, leaking of fluid, or if you have not felt normal fetal movement.  ° °

## 2018-08-11 NOTE — Addendum Note (Signed)
Addended by: Anell Barr on: 08/11/2018 03:28 PM   Modules accepted: Orders

## 2018-08-12 LAB — HEMOGLOBIN A1C
Est. average glucose Bld gHb Est-mCnc: 105 mg/dL
Hgb A1c MFr Bld: 5.3 % (ref 4.8–5.6)

## 2018-08-12 LAB — OBSTETRIC PANEL, INCLUDING HIV
Antibody Screen: NEGATIVE
Basophils Absolute: 0 10*3/uL (ref 0.0–0.2)
Basos: 0 %
EOS (ABSOLUTE): 0.1 10*3/uL (ref 0.0–0.4)
Eos: 1 %
HIV Screen 4th Generation wRfx: NONREACTIVE
Hematocrit: 33.5 % — ABNORMAL LOW (ref 34.0–46.6)
Hemoglobin: 11.7 g/dL (ref 11.1–15.9)
Hepatitis B Surface Ag: NEGATIVE
Immature Grans (Abs): 0 10*3/uL (ref 0.0–0.1)
Immature Granulocytes: 0 %
Lymphocytes Absolute: 3 10*3/uL (ref 0.7–3.1)
Lymphs: 43 %
MCH: 30.2 pg (ref 26.6–33.0)
MCHC: 34.9 g/dL (ref 31.5–35.7)
MCV: 87 fL (ref 79–97)
Monocytes Absolute: 0.5 10*3/uL (ref 0.1–0.9)
Monocytes: 7 %
Neutrophils Absolute: 3.3 10*3/uL (ref 1.4–7.0)
Neutrophils: 49 %
Platelets: 404 10*3/uL (ref 150–450)
RBC: 3.87 x10E6/uL (ref 3.77–5.28)
RDW: 12.9 % (ref 11.7–15.4)
RPR Ser Ql: NONREACTIVE
Rh Factor: POSITIVE
Rubella Antibodies, IGG: 1.73 index (ref >0.99)
WBC: 7 10*3/uL (ref 3.4–10.8)

## 2018-08-12 LAB — TSH: TSH: 1.32 u[IU]/mL (ref 0.450–4.500)

## 2018-08-13 LAB — URINE CULTURE

## 2018-08-14 MED ORDER — CEPHALEXIN 500 MG PO CAPS: 500.0000 mg | ORAL_CAPSULE | Freq: Four times a day (QID) | ORAL | 0 refills | Status: DC

## 2018-08-14 NOTE — Addendum Note (Signed)
Addended by: Leroy Libman on: 08/14/2018 01:18 AM   Modules accepted: Orders

## 2018-08-17 LAB — CYTOLOGY - PAP
Chlamydia: NEGATIVE
Diagnosis: NEGATIVE
HPV: NOT DETECTED
Neisseria Gonorrhea: NEGATIVE

## 2018-09-07 ENCOUNTER — Encounter: Payer: Self-pay | Admitting: Advanced Practice Midwife

## 2018-09-07 ENCOUNTER — Ambulatory Visit (INDEPENDENT_AMBULATORY_CARE_PROVIDER_SITE_OTHER): Payer: No Typology Code available for payment source | Admitting: Advanced Practice Midwife

## 2018-09-07 VITALS — BP 133/83 | Wt 260.0 lb

## 2018-09-07 DIAGNOSIS — Z3A16 16 weeks gestation of pregnancy: Secondary | ICD-10-CM

## 2018-09-07 DIAGNOSIS — Z3482 Encounter for supervision of other normal pregnancy, second trimester: Secondary | ICD-10-CM

## 2018-09-07 DIAGNOSIS — Z98891 History of uterine scar from previous surgery: Secondary | ICD-10-CM

## 2018-09-07 DIAGNOSIS — Z3480 Encounter for supervision of other normal pregnancy, unspecified trimester: Secondary | ICD-10-CM

## 2018-09-07 NOTE — Progress Notes (Signed)
   TELEHEALTH OBSTETRICS PRENATAL VIRTUAL VIDEO VISIT ENCOUNTER NOTE  Provider location: [facility] [place] connected with Theodoro Grist on 09/07/18 at  9:00 AM EDT by WebEx Video Encounter at home and verified that I am speaking with the correct person using two identifiers.   I discussed the limitations, risks, security and privacy concerns of performing an evaluation and management service by telephone and the availability of in person appointments. I also discussed with the patient that there may be a patient responsible charge related to this service. The patient expressed understanding and agreed to proceed. Subjective:  Kiara Romero is a 33 y.o. G2P1001 at [redacted]w[redacted]d being seen today for ongoing prenatal care.  She is currently monitored for the following issues for this low-risk pregnancy and has Acute pain of right knee; Supervision of other normal pregnancy, antepartum; History of C-section; Elevated blood pressure reading without diagnosis of hypertension; and Obesity affecting pregnancy on their problem list.  Patient reports no complaints.  Contractions: Not present. Vag. Bleeding: None.  Movement: Present. Denies any leaking of fluid.   Is feeling movement Bought a treadmill, wants to know if it is ok to use  The following portions of the patient's history were reviewed and updated as appropriate: allergies, current medications, past family history, past medical history, past social history, past surgical history and problem list.   Objective:   Vitals:   09/07/18 0923  BP: 133/83  Weight: 117.9 kg    Fetal Status:     Movement: Present     General:  Alert, oriented and cooperative. Patient is in no acute distress.  Respiratory: Normal respiratory effort, no problems with respiration noted  Mental Status: Normal mood and affect. Normal behavior. Normal judgment and thought content.  Rest of physical exam deferred due to type of  encounter  Imaging: No results found.  Assessment and Plan:  Pregnancy: G2P1001 at [redacted]w[redacted]d 1. Supervision of other normal pregnancy, antepartum Discussed exercise recommendations as well as precautions.  Discussed expectations for gest age.  - AFP, Serum, Open Spina Bifida  Preterm labor symptoms and general obstetric precautions including but not limited to vaginal bleeding, contractions, leaking of fluid and fetal movement were reviewed in detail with the patient. I discussed the assessment and treatment plan with the patient. The patient was provided an opportunity to ask questions and all were answered. The patient agreed with the plan and demonstrated an understanding of the instructions. The patient was advised to call back or seek an in-person office evaluation/go to [facility] at [facility] for any urgent or concerning symptoms. Please refer to After Visit Summary for other counseling recommendations.   I provided 25 minutes of face-to-face time during this encounter.   Future Appointments  Date Time Provider [place]  09/21/2018  7:45 AM [place] [facility]  09/21/2018  7:45 AM [place] 2 WH-[facility]  10/07/2018  1:15 PM [place], Tanna Savoy, DO [facility] None    Hansel Feinstein, [place] for [employer], [place] [facility]

## 2018-09-07 NOTE — Patient Instructions (Signed)

## 2018-09-07 NOTE — Progress Notes (Signed)
  Patient agrees to [employer]. Kathrene Alu RN

## 2018-09-07 NOTE — Progress Notes (Deleted)
  Subjective:    Kiara Romero is being seen today for her first obstetrical visit.  This {is/is not:9024} a planned pregnancy. She is at [redacted]w[redacted]d gestation. Her obstetrical history is significant for {ob risk factors:10154}. Relationship with FOB: {fob:16621}. Patient {does/does not:19097} intend to breast feed. Pregnancy history fully reviewed.  Patient reports {sx:14538}.  Review of Systems:   Review of Systems  Objective:     BP 133/83   Wt 117.9 kg   LMP 05/18/2018   BMI 46.06 kg/m  Physical Exam  Exam    Assessment:    Pregnancy: G2P1001 Patient Active Problem List   Diagnosis Date Noted  . Supervision of other normal pregnancy, antepartum 08/11/2018  . History of C-section 08/11/2018  . Elevated blood pressure reading without diagnosis of hypertension 08/11/2018  . Obesity affecting pregnancy 08/11/2018  . Acute pain of right knee 05/15/2017       Plan:     Initial labs drawn. Prenatal vitamins. Problem list reviewed and updated. AFP3 discussed: {requests/ordered/declines:14581}. Role of ultrasound in pregnancy discussed; fetal survey: {requests/ordered/declines:14581}. Amniocentesis discussed: {amniocentesis:14582}. Follow up in {numbers 0-4:31231} weeks. ***% of *** min visit spent on counseling and coordination of care.  ***   Hansel Feinstein 09/07/2018

## 2018-09-15 ENCOUNTER — Other Ambulatory Visit: Payer: Self-pay

## 2018-09-15 ENCOUNTER — Ambulatory Visit (INDEPENDENT_AMBULATORY_CARE_PROVIDER_SITE_OTHER): Payer: No Typology Code available for payment source | Admitting: Family Medicine

## 2018-09-15 VITALS — BP 133/68 | HR 97 | Wt 266.0 lb

## 2018-09-15 DIAGNOSIS — Z3482 Encounter for supervision of other normal pregnancy, second trimester: Secondary | ICD-10-CM

## 2018-09-15 DIAGNOSIS — Z3A17 17 weeks gestation of pregnancy: Secondary | ICD-10-CM

## 2018-09-15 DIAGNOSIS — Z3480 Encounter for supervision of other normal pregnancy, unspecified trimester: Secondary | ICD-10-CM

## 2018-09-15 DIAGNOSIS — R030 Elevated blood-pressure reading, without diagnosis of hypertension: Secondary | ICD-10-CM

## 2018-09-15 DIAGNOSIS — Z98891 History of uterine scar from previous surgery: Secondary | ICD-10-CM

## 2018-09-15 NOTE — Patient Instructions (Signed)

## 2018-09-15 NOTE — Progress Notes (Signed)
   PRENATAL VISIT NOTE  Subjective:  Kiara Romero is a 33 y.o. G2P1001 at [redacted]w[redacted]d being seen today for ongoing prenatal care.  She is currently monitored for the following issues for this low-risk pregnancy and has Acute pain of right knee; Supervision of other normal pregnancy, antepartum; History of C-section; Elevated blood pressure reading without diagnosis of hypertension; and Obesity affecting pregnancy on their problem list.  Patient reports fatigue.  Contractions: Not present. Vag. Bleeding: None.  Movement: Present. Denies leaking of fluid.   The following portions of the patient's history were reviewed and updated as appropriate: allergies, current medications, past family history, past medical history, past social history, past surgical history and problem list.   Objective:   Vitals:   09/15/18 1555  BP: 133/68  Pulse: 97  Weight: 266 lb 0.6 oz (120.7 kg)    Fetal Status: Fetal Heart Rate (bpm): 155   Movement: Present     General:  Alert, oriented and cooperative. Patient is in no acute distress.  Skin: Skin is warm and dry. No rash noted.   Cardiovascular: Normal heart rate noted  Respiratory: Normal respiratory effort, no problems with respiration noted  Abdomen: Soft, gravid, appropriate for gestational age.  Pain/Pressure: Absent     Pelvic: Cervical exam deferred        Extremities: Normal range of motion.  Edema: None  Mental Status: Normal mood and affect. Normal behavior. Normal judgment and thought content.   Assessment and Plan:  Pregnancy: G2P1001 at [redacted]w[redacted]d 1. Supervision of other normal pregnancy, antepartum Continue routine prenatal care. Anatomy US scheduled Suspect fatigue is related to age, pregnancy, sleep disruption. Has normal TSH, A1C and Hgb. Discussed at length. May try B12--won't hurt.  2. History of C-section   3. Elevated blood pressure reading without diagnosis of hypertension BP ok--continue ASA  general obstetric precautions including  but not limited to vaginal bleeding, contractions, leaking of fluid and fetal movement were reviewed in detail with the patient. Please refer to After Visit Summary for other counseling recommendations.   Return in 4 weeks (on 10/13/2018) for virtual.  Future Appointments  Date Time Provider [place]  09/21/2018  7:45 AM [place] [facility]  09/21/2018  7:45 AM [place] 2 WH-[facility]US [facility]  10/07/2018  1:15 PM Tru[place] Main[place], DO [facility] None    Donnamae Jude, MD

## 2018-09-21 ENCOUNTER — Other Ambulatory Visit (HOSPITAL_COMMUNITY): Payer: Self-pay | Admitting: *Deleted

## 2018-09-21 ENCOUNTER — Other Ambulatory Visit: Payer: Self-pay

## 2018-09-21 ENCOUNTER — Ambulatory Visit (HOSPITAL_COMMUNITY)
Admission: RE | Admit: 2018-09-21 | Discharge: 2018-09-21 | Disposition: A | Discharge status: Home / Self Care | Payer: No Typology Code available for payment source | Source: Ambulatory Visit | Attending: Obstetrics and Gynecology | Admitting: Obstetrics and Gynecology

## 2018-09-21 ENCOUNTER — Encounter (HOSPITAL_COMMUNITY): Payer: Self-pay | Admitting: *Deleted

## 2018-09-21 ENCOUNTER — Ambulatory Visit (HOSPITAL_COMMUNITY): Payer: No Typology Code available for payment source | Admitting: *Deleted

## 2018-09-21 DIAGNOSIS — Z3480 Encounter for supervision of other normal pregnancy, unspecified trimester: Secondary | ICD-10-CM

## 2018-09-21 DIAGNOSIS — O09522 Supervision of elderly multigravida, second trimester: Secondary | ICD-10-CM | POA: Diagnosis not present

## 2018-09-21 DIAGNOSIS — O99212 Obesity complicating pregnancy, second trimester: Secondary | ICD-10-CM | POA: Diagnosis not present

## 2018-09-21 DIAGNOSIS — O99211 Obesity complicating pregnancy, first trimester: Secondary | ICD-10-CM | POA: Insufficient documentation

## 2018-09-21 DIAGNOSIS — R030 Elevated blood-pressure reading, without diagnosis of hypertension: Secondary | ICD-10-CM | POA: Insufficient documentation

## 2018-09-21 DIAGNOSIS — Z3A18 18 weeks gestation of pregnancy: Secondary | ICD-10-CM | POA: Diagnosis not present

## 2018-09-21 DIAGNOSIS — Z362 Encounter for other antenatal screening follow-up: Secondary | ICD-10-CM

## 2018-09-21 DIAGNOSIS — O34219 Maternal care for unspecified type scar from previous cesarean delivery: Secondary | ICD-10-CM | POA: Diagnosis not present

## 2018-10-07 ENCOUNTER — Ambulatory Visit (INDEPENDENT_AMBULATORY_CARE_PROVIDER_SITE_OTHER): Payer: No Typology Code available for payment source | Admitting: Family Medicine

## 2018-10-07 DIAGNOSIS — R030 Elevated blood-pressure reading, without diagnosis of hypertension: Secondary | ICD-10-CM

## 2018-10-07 DIAGNOSIS — Z3A20 20 weeks gestation of pregnancy: Secondary | ICD-10-CM

## 2018-10-07 DIAGNOSIS — Z98891 History of uterine scar from previous surgery: Secondary | ICD-10-CM

## 2018-10-07 DIAGNOSIS — Z3480 Encounter for supervision of other normal pregnancy, unspecified trimester: Secondary | ICD-10-CM

## 2018-10-07 DIAGNOSIS — Z3482 Encounter for supervision of other normal pregnancy, second trimester: Secondary | ICD-10-CM

## 2018-10-07 NOTE — Progress Notes (Signed)
Last BP was 122/82 on 09/29/18.

## 2018-10-07 NOTE — Progress Notes (Signed)
TELEHEALTH OBSTETRICS PRENATAL VIRTUAL VIDEO VISIT ENCOUNTER NOTE  [facility]: [facility]   I connected with Kiara Romero on 10/07/18 at  1:15 PM EDT by WebEx Video Encounter at home and verified that I am speaking with the correct person using two identifiers.   I discussed the limitations, risks, security and privacy concerns of performing an evaluation and management service virtually and the availability of in person appointments. I also discussed with the patient that there may be a patient responsible charge related to this service. The patient expressed understanding and agreed to proceed. Subjective:  Kiara SkinnerRenee M Romero is a 33 y.o. G2P1001 at 10954w2d being seen today for ongoing prenatal care.  She is currently monitored for the following issues for this high-risk pregnancy and has Acute pain of right knee; Supervision of other normal pregnancy, antepartum; History of C-section; Elevated blood pressure reading without diagnosis of hypertension; and Obesity affecting pregnancy on their problem list.  Patient reports no complaints.  Contractions: Not present. Vag. Bleeding: None.  Movement: Present. Denies any leaking of fluid.   The following portions of the patient's history were reviewed and updated as appropriate: allergies, current medications, past family history, past medical history, past social history, past surgical history and problem list.   Objective:  There were no vitals filed for this visit.  Fetal Status:     Movement: Present     General:  Alert, oriented and cooperative. Patient is in no acute distress.  Respiratory: Normal respiratory effort, no problems with respiration noted  Mental Status: Normal mood and affect. Normal behavior. Normal judgment and thought content.  Rest of physical exam deferred due to type of encounter  Imaging: [place] Mfm Ob Detail +14 Wk  Result Date: 09/21/2018  ----------------------------------------------------------------------  OBSTETRICS REPORT                       (Signed Final 09/21/2018 09:35 am) ---------------------------------------------------------------------- Patient Info  ID #:       [id number]                          D.O.B.:  11-14-1985 (33 yrs)  Name:       Kiara Romero                  Visit Date: 09/21/2018 07:54 am ---------------------------------------------------------------------- Performed By  Performed By:     Tomma Lightningevin Vics             [address]. [address]     [address]                    [address]                                                             [address]  Attending:        Noralee Spaceavi Shankar MD        [facility]  Referred By:      Conan BowensKELLY M DAVIS                    MD ---------------------------------------------------------------------- Orders   #  Description                          Code         Ordered By   1  US MFM OB DETAIL +14 WK              76811.01     KELLY DAVIS  ----------------------------------------------------------------------   #  Order #                    Accession #                 Episode #   1  [id number]                  [id number]                  [id number]  ---------------------------------------------------------------------- Indications   Obesity complicating pregnancy, second         O99.212   trimester   Encounter for antenatal screening for          Z36.3   malformations   [redacted] weeks gestation of pregnancy                Z3A.18   History of cesarean delivery, currently        85O34.219   pregnant   Advanced maternal age multigravida 7235+,        90O09.522   second trimester  ---------------------------------------------------------------------- Vital Signs  Weight (lb): 266                               Height:        5'3"  BMI:         47.11 ---------------------------------------------------------------------- Fetal Evaluation   Num Of Fetuses:         1  Fetal Heart Rate(bpm):  153  Cardiac Activity:       Observed  Presentation:           Cephalic  Placenta:               Posterior  P. Cord Insertion:      Not well visualized  Amniotic Fluid  AFI FV:      Within normal limits                              Largest Pocket(cm)                              3.86 ---------------------------------------------------------------------- Biometry  BPD:      40.3  mm     G. Age:  18w 2d         61  %    CI:        80.06   %    70 - 86  FL/HC:      17.1   %    15.8 - 18  HC:      142.3  mm     G. Age:  17w 4d         19  %    HC/AC:      1.26        1.07 - 1.29  AC:      112.5  mm     G. Age:  17w 0d         19  %    FL/BPD:     60.5   %  FL:       24.4  mm     G. Age:  17w 3d         22  %    FL/AC:      21.7   %    20 - 24  HUM:      23.5  mm     G. Age:  17w 2d         33  %  CER:      17.7  mm     G. Age:  17w 5d         43  %  LV:        6.6  mm  CM:        2.8  mm  Est. FW:     188  gm      0 lb 7 oz     11  % ---------------------------------------------------------------------- OB History  Gravidity:    2         Term:   1  Living:       1 ---------------------------------------------------------------------- Gestational Age  LMP:           18w 0d        Date:  05/18/18                 EDD:   02/22/19  U/S Today:     17w 4d                                        EDD:   02/25/19  Best:          18w 0d     Det. By:  LMP  (05/18/18)          EDD:   02/22/19 ---------------------------------------------------------------------- Anatomy  Cranium:               Appears normal         Aortic Arch:            Not well visualized  Cavum:                 Appears normal         Ductal Arch:            Not well visualized  Ventricles:            Appears normal         Diaphragm:              Not well visualized  Choroid Plexus:        Appears normal         Stomach:                Appears normal, left  sided  Cerebellum:            Appears normal         Abdomen:                Appears normal  Posterior Fossa:       Appears normal         Abdominal Wall:         Not well visualized  Nuchal Fold:           Not well visualized    Cord Vessels:           Appears normal (3                                                                        vessel cord)  Face:                  Profile nl; orbits not Kidneys:                Appear normal                         well visualized  Lips:                  Not well visualized    Bladder:                Appears normal  Palate:                Not well visualized    Spine:                  Ltd views no                                                                        intracranial signs of                                                                        NTD  Heart:                 Not well visualized    Upper Extremities:      Visualized  RVOT:                  Not well visualized    Lower Extremities:      Visualized  LVOT:                  Not well visualized  Other:  Fetus appears to be female. Heels visualized. Technically difficult due          to maternal habitus and fetal position. ---------------------------------------------------------------------- Cervix Uterus Adnexa  Cervix  Length:           4.08  cm.  Normal appearance by transabdominal scan.  Uterus  Single fibroid noted, see table below.  Left Ovary  Not visualized.  Right Ovary  Not visualized. ---------------------------------------------------------------------- Myomas   Site                     L(cm)      W(cm)      D(cm)      Location   Posterior                6.2        5.2        3.6  ----------------------------------------------------------------------   Blood Flow                 RI        PI       Comments  ---------------------------------------------------------------------- Impression  We performed fetal anatomy  scan. No makers of  aneuploidies or fetal structural defects are seen. Fetal  biometry is consistent with her previously-established dates.  Amniotic fluid is normal and good fetal activity is seen.  Patient understands the limitations of ultrasound in detecting  fetal anomalies.  Placenta is posterior and there is no evidence of previa or  accreta. 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  Patient had cell-free fetal DNA screening and the results are  pending. ---------------------------------------------------------------------- Recommendations  An appointment was made for her to return in 4 weeks for  completion of fetal anatomy. ----------------------------------------------------------------------                  Tama High, MD Electronically Signed Final Report   09/21/2018 09:35 am ----------------------------------------------------------------------   Assessment and Plan:  Pregnancy: G2P1001 at [redacted]w[redacted]d 1. Supervision of other normal pregnancy, antepartum FHT and FH normal  2. History of C-section Patient leaning toward c/section  3. Elevated blood pressure reading without diagnosis of hypertension Will continue to monitor.  Preterm labor symptoms and general obstetric precautions including but not limited to vaginal bleeding, contractions, leaking of fluid and fetal movement were reviewed in detail with the patient. I discussed the assessment and treatment plan with the patient. The patient was provided an opportunity to ask questions and all were answered. The patient agreed with the plan and demonstrated an understanding of the instructions. The patient was advised to call back or seek an in-person office evaluation/go to [facility] for any urgent or concerning symptoms. Please refer to After Visit Summary for other counseling recommendations.   I provided 13 minutes of face-to-face time during this encounter.  No follow-ups on file.   Future Appointments  Date Time Provider [place]  10/19/2018  8:30 AM [place] [facility]  10/19/2018  8:30 AM [facility]-MFC [place] 5 WH-MFCUS MFC-US    Amarylis Rovito J Latrel Szymczak, DO [facility] [employer] [facility], [place]

## 2018-10-19 ENCOUNTER — Ambulatory Visit (HOSPITAL_COMMUNITY)
Admission: RE | Admit: 2018-10-19 | Discharge: 2018-10-19 | Disposition: A | Discharge status: Home / Self Care | Payer: No Typology Code available for payment source | Source: Ambulatory Visit | Attending: Obstetrics and Gynecology | Admitting: Obstetrics and Gynecology

## 2018-10-19 ENCOUNTER — Ambulatory Visit: Payer: No Typology Code available for payment source | Admitting: Obstetrics & Gynecology

## 2018-10-19 ENCOUNTER — Ambulatory Visit (HOSPITAL_COMMUNITY): Payer: No Typology Code available for payment source | Admitting: *Deleted

## 2018-10-19 ENCOUNTER — Encounter (HOSPITAL_COMMUNITY): Payer: Self-pay | Admitting: *Deleted

## 2018-10-19 ENCOUNTER — Other Ambulatory Visit (HOSPITAL_COMMUNITY): Payer: Self-pay | Admitting: *Deleted

## 2018-10-19 ENCOUNTER — Telehealth: Payer: Self-pay

## 2018-10-19 ENCOUNTER — Other Ambulatory Visit: Payer: Self-pay

## 2018-10-19 DIAGNOSIS — Z3480 Encounter for supervision of other normal pregnancy, unspecified trimester: Secondary | ICD-10-CM

## 2018-10-19 DIAGNOSIS — O99212 Obesity complicating pregnancy, second trimester: Secondary | ICD-10-CM | POA: Diagnosis not present

## 2018-10-19 DIAGNOSIS — R350 Frequency of micturition: Secondary | ICD-10-CM

## 2018-10-19 DIAGNOSIS — O34219 Maternal care for unspecified type scar from previous cesarean delivery: Secondary | ICD-10-CM

## 2018-10-19 DIAGNOSIS — Z362 Encounter for other antenatal screening follow-up: Secondary | ICD-10-CM

## 2018-10-19 DIAGNOSIS — Z3A22 22 weeks gestation of pregnancy: Secondary | ICD-10-CM

## 2018-10-19 DIAGNOSIS — O09522 Supervision of elderly multigravida, second trimester: Secondary | ICD-10-CM | POA: Diagnosis not present

## 2018-10-19 NOTE — Progress Notes (Signed)
Patient complaining of increase in urinary frequency. Patient not having any dysuria.   Patient given medications safe in pregnancy sheet. Will send urine for culture and treat once we have results. Kathrene Alu RN  Attestation of Attending Supervision of RN: Evaluation and management procedures were performed by the nurse under my supervision and collaboration.  I have reviewed the nursing note and chart, and I agree with the management and plan.  Carolyn L. Harraway-Smith, M.D., Cherlynn June

## 2018-10-19 NOTE — Telephone Encounter (Signed)
Patient called stating she feels like she has a UTI. Patient states she is having some burning after urination.  Patient also states she had some light spotting after some constipation. Patient recommended to take stool softner from the list of medication safe in pregnancy that was given to her. Patient coming today to give urine sample for urine culture. Kathrene Alu RN

## 2018-10-20 LAB — POCT URINALYSIS DIPSTICK
Bilirubin, UA: NEGATIVE
Glucose, UA: NEGATIVE
Nitrite, UA: NEGATIVE
Protein, UA: NEGATIVE
Urobilinogen, UA: 1 E.U./dL

## 2018-10-21 LAB — URINE CULTURE

## 2018-11-05 ENCOUNTER — Encounter: Payer: Self-pay | Admitting: Family Medicine

## 2018-11-05 ENCOUNTER — Other Ambulatory Visit: Payer: Self-pay

## 2018-11-05 ENCOUNTER — Ambulatory Visit (INDEPENDENT_AMBULATORY_CARE_PROVIDER_SITE_OTHER): Payer: No Typology Code available for payment source | Admitting: Family Medicine

## 2018-11-05 VITALS — BP 110/65 | HR 90 | Wt 267.0 lb

## 2018-11-05 DIAGNOSIS — Z3480 Encounter for supervision of other normal pregnancy, unspecified trimester: Secondary | ICD-10-CM

## 2018-11-05 DIAGNOSIS — O99212 Obesity complicating pregnancy, second trimester: Secondary | ICD-10-CM

## 2018-11-05 DIAGNOSIS — Z23 Encounter for immunization: Secondary | ICD-10-CM | POA: Diagnosis not present

## 2018-11-05 DIAGNOSIS — Z98891 History of uterine scar from previous surgery: Secondary | ICD-10-CM

## 2018-11-05 DIAGNOSIS — Z3A24 24 weeks gestation of pregnancy: Secondary | ICD-10-CM

## 2018-11-05 DIAGNOSIS — O99211 Obesity complicating pregnancy, first trimester: Secondary | ICD-10-CM

## 2018-11-05 DIAGNOSIS — R030 Elevated blood-pressure reading, without diagnosis of hypertension: Secondary | ICD-10-CM

## 2018-11-05 NOTE — Progress Notes (Signed)
   PRENATAL VISIT NOTE  Subjective:  Kiara Romero is a 33 y.o. G2P1001 at [redacted]w[redacted]d being seen today for ongoing prenatal care.  She is currently monitored for the following issues for this high-risk pregnancy and has Acute pain of right knee; Supervision of other normal pregnancy, antepartum; History of C-section; Elevated blood pressure reading without diagnosis of hypertension; and Obesity affecting pregnancy on their problem list.  Patient reports no complaints.  Contractions: Not present. Vag. Bleeding: None.  Movement: Present. Denies leaking of fluid.   The following portions of the patient's history were reviewed and updated as appropriate: allergies, current medications, past family history, past medical history, past social history, past surgical history and problem list.   Objective:   Vitals:   11/05/18 0843 11/05/18 0906  BP: (!) 141/73 110/65  Pulse: 90 90  Weight: 267 lb (121.1 kg)     Fetal Status: Fetal Heart Rate (bpm): 150 Fundal Height: 27 cm Movement: Present     General:  Alert, oriented and cooperative. Patient is in no acute distress.  Skin: Skin is warm and dry. No rash noted.   Cardiovascular: Normal heart rate noted  Respiratory: Normal respiratory effort, no problems with respiration noted  Abdomen: Soft, gravid, appropriate for gestational age.  Pain/Pressure: Absent     Pelvic: Cervical exam deferred        Extremities: Normal range of motion.  Edema: None  Mental Status: Normal mood and affect. Normal behavior. Normal judgment and thought content.   Assessment and Plan:  Pregnancy: G2P1001 at [redacted]w[redacted]d 1. Supervision of other normal pregnancy, antepartum FHT and FH normal - Glucose Tolerance, 2 Hours w/1 Hour - CBC - HIV antibody (with reflex) - RPR  2. Obesity affecting pregnancy in first trimester Continue [place] - Glucose Tolerance, 2 Hours w/1 Hour - CBC - HIV antibody (with reflex) - RPR  3. Elevated blood pressure reading without diagnosis of  hypertension Will watch cautiously. First BP elevated due to rushing here.   4. History of C-section   Preterm labor symptoms and general obstetric precautions including but not limited to vaginal bleeding, contractions, leaking of fluid and fetal movement were reviewed in detail with the patient. Please refer to After Visit Summary for other counseling recommendations.   Return in about 4 weeks (around 12/03/2018) for OB f/u, In Office.  Future Appointments  Date Time Provider [place]  11/19/2018  8:15 AM [place] 4 WH-MFCUS MFC-US  12/03/2018  8:45 AM Lavonia Drafts, MD [facility] None    Truett Mainland, DO

## 2018-11-06 LAB — GLUCOSE TOLERANCE, 2 HOURS W/ 1HR
Glucose, 1 hour: 94 mg/dL (ref 65–179)
Glucose, 2 hour: 109 mg/dL (ref 65–152)
Glucose, Fasting: 80 mg/dL (ref 65–91)

## 2018-11-06 LAB — CBC
Hematocrit: 34.6 % (ref 34.0–46.6)
Hemoglobin: 11.9 g/dL (ref 11.1–15.9)
MCH: 29.5 pg (ref 26.6–33.0)
MCHC: 34.4 g/dL (ref 31.5–35.7)
MCV: 86 fL (ref 79–97)
Platelets: 369 10*3/uL (ref 150–450)
RBC: 4.04 x10E6/uL (ref 3.77–5.28)
RDW: 12.6 % (ref 11.7–15.4)
WBC: 8.1 10*3/uL (ref 3.4–10.8)

## 2018-11-06 LAB — HIV ANTIBODY (ROUTINE TESTING W REFLEX): HIV Screen 4th Generation wRfx: NONREACTIVE

## 2018-11-06 LAB — RPR: RPR Ser Ql: NONREACTIVE

## 2018-11-14 ENCOUNTER — Other Ambulatory Visit: Payer: Self-pay | Admitting: Obstetrics and Gynecology

## 2018-11-19 ENCOUNTER — Other Ambulatory Visit (HOSPITAL_COMMUNITY): Payer: Self-pay | Admitting: *Deleted

## 2018-11-19 ENCOUNTER — Ambulatory Visit (HOSPITAL_COMMUNITY)
Admission: RE | Admit: 2018-11-19 | Discharge: 2018-11-19 | Disposition: A | Discharge status: Home / Self Care | Payer: No Typology Code available for payment source | Source: Ambulatory Visit | Attending: Obstetrics and Gynecology | Admitting: Obstetrics and Gynecology

## 2018-11-19 ENCOUNTER — Other Ambulatory Visit: Payer: Self-pay

## 2018-11-19 DIAGNOSIS — O09529 Supervision of elderly multigravida, unspecified trimester: Secondary | ICD-10-CM

## 2018-11-19 DIAGNOSIS — O09522 Supervision of elderly multigravida, second trimester: Secondary | ICD-10-CM

## 2018-11-19 DIAGNOSIS — Z362 Encounter for other antenatal screening follow-up: Secondary | ICD-10-CM | POA: Insufficient documentation

## 2018-11-19 DIAGNOSIS — O99212 Obesity complicating pregnancy, second trimester: Secondary | ICD-10-CM

## 2018-11-19 DIAGNOSIS — O34219 Maternal care for unspecified type scar from previous cesarean delivery: Secondary | ICD-10-CM

## 2018-11-19 DIAGNOSIS — Z3A26 26 weeks gestation of pregnancy: Secondary | ICD-10-CM

## 2018-12-03 ENCOUNTER — Encounter: Payer: Self-pay | Admitting: Obstetrics & Gynecology

## 2018-12-03 ENCOUNTER — Ambulatory Visit (INDEPENDENT_AMBULATORY_CARE_PROVIDER_SITE_OTHER): Payer: No Typology Code available for payment source | Admitting: Obstetrics & Gynecology

## 2018-12-03 ENCOUNTER — Other Ambulatory Visit: Payer: Self-pay

## 2018-12-03 VITALS — BP 124/55 | HR 88 | Wt 270.0 lb

## 2018-12-03 DIAGNOSIS — O99210 Obesity complicating pregnancy, unspecified trimester: Secondary | ICD-10-CM

## 2018-12-03 DIAGNOSIS — O99213 Obesity complicating pregnancy, third trimester: Secondary | ICD-10-CM

## 2018-12-03 DIAGNOSIS — Z3480 Encounter for supervision of other normal pregnancy, unspecified trimester: Secondary | ICD-10-CM

## 2018-12-03 DIAGNOSIS — O26893 Other specified pregnancy related conditions, third trimester: Secondary | ICD-10-CM

## 2018-12-03 DIAGNOSIS — Z3A28 28 weeks gestation of pregnancy: Secondary | ICD-10-CM

## 2018-12-03 DIAGNOSIS — Z98891 History of uterine scar from previous surgery: Secondary | ICD-10-CM

## 2018-12-03 DIAGNOSIS — R030 Elevated blood-pressure reading, without diagnosis of hypertension: Secondary | ICD-10-CM

## 2018-12-03 NOTE — Patient Instructions (Signed)
Levonorgestrel intrauterine device (IUD) What is this medicine? LEVONORGESTREL IUD (LEE voe nor jes trel) is a contraceptive (birth control) device. The device is placed inside the uterus by a healthcare professional. It is used to prevent pregnancy. This device can also be used to treat heavy bleeding that occurs during your period. This medicine may be used for other purposes; ask your health care provider or pharmacist if you have questions. COMMON BRAND NAME(S): Kyleena, LILETTA, Mirena, Skyla What should I tell my health care provider before I take this medicine? They need to know if you have any of these conditions:  abnormal Pap smear  cancer of the breast, uterus, or cervix  diabetes  endometritis  genital or pelvic infection now or in the past  have more than one sexual partner or your partner has more than one partner  heart disease  history of an ectopic or tubal pregnancy  immune system problems  IUD in place  liver disease or tumor  problems with blood clots or take blood-thinners  seizures  use intravenous drugs  uterus of unusual shape  vaginal bleeding that has not been explained  an unusual or allergic reaction to levonorgestrel, other hormones, silicone, or polyethylene, medicines, foods, dyes, or preservatives  pregnant or trying to get pregnant  breast-feeding How should I use this medicine? This device is placed inside the uterus by a health care professional. Talk to your pediatrician regarding the use of this medicine in children. Special care may be needed. Overdosage: If you think you have taken too much of this medicine contact a poison control center or emergency room at once. NOTE: This medicine is only for you. Do not share this medicine with others. What if I miss a dose? This does not apply. Depending on the brand of device you have inserted, the device will need to be replaced every 3 to 6 years if you wish to continue using this type  of birth control. What may interact with this medicine? Do not take this medicine with any of the following medications:  amprenavir  bosentan  fosamprenavir This medicine may also interact with the following medications:  aprepitant  armodafinil  barbiturate medicines for inducing sleep or treating seizures  bexarotene  boceprevir  griseofulvin  medicines to treat seizures like carbamazepine, ethotoin, felbamate, oxcarbazepine, phenytoin, topiramate  modafinil  pioglitazone  rifabutin  rifampin  rifapentine  some medicines to treat HIV infection like atazanavir, efavirenz, indinavir, lopinavir, nelfinavir, tipranavir, ritonavir  St. John's wort  warfarin This list may not describe all possible interactions. Give your health care provider a list of all the medicines, herbs, non-prescription drugs, or dietary supplements you use. Also tell them if you smoke, drink alcohol, or use illegal drugs. Some items may interact with your medicine. What should I watch for while using this medicine? Visit your doctor or health care professional for regular check ups. See your doctor if you or your partner has sexual contact with others, becomes HIV positive, or gets a sexual transmitted disease. This product does not protect you against HIV infection (AIDS) or other sexually transmitted diseases. You can check the placement of the IUD yourself by reaching up to the top of your vagina with clean fingers to feel the threads. Do not pull on the threads. It is a good habit to check placement after each menstrual period. Call your doctor right away if you feel more of the IUD than just the threads or if you cannot feel the threads at   all. The IUD may come out by itself. You may become pregnant if the device comes out. If you notice that the IUD has come out use a backup birth control method like condoms and call your health care provider. Using tampons will not change the position of the  IUD and are okay to use during your period. This IUD can be safely scanned with magnetic resonance imaging (MRI) only under specific conditions. Before you have an MRI, tell your healthcare provider that you have an IUD in place, and which type of IUD you have in place. What side effects may I notice from receiving this medicine? Side effects that you should report to your doctor or health care professional as soon as possible:  allergic reactions like skin rash, itching or hives, swelling of the face, lips, or tongue  fever, flu-like symptoms  genital sores  high blood pressure  no menstrual period for 6 weeks during use  pain, swelling, warmth in the leg  pelvic pain or tenderness  severe or sudden headache  signs of pregnancy  stomach cramping  sudden shortness of breath  trouble with balance, talking, or walking  unusual vaginal bleeding, discharge  yellowing of the eyes or skin Side effects that usually do not require medical attention (report to your doctor or health care professional if they continue or are bothersome):  acne  breast pain  change in sex drive or performance  changes in weight  cramping, dizziness, or faintness while the device is being inserted  headache  irregular menstrual bleeding within first 3 to 6 months of use  nausea This list may not describe all possible side effects. Call your doctor for medical advice about side effects. You may report side effects to FDA at 1-800-FDA-1088. Where should I keep my medicine? This does not apply. NOTE: This sheet is a summary. It may not cover all possible information. If you have questions about this medicine, talk to your doctor, pharmacist, or health care provider.  2020 Elsevier/Gold Standard (2018-01-12 13:22:01) Contraception Choices Contraception, also called birth control, refers to methods or devices that prevent pregnancy. Hormonal methods Contraceptive implant  A contraceptive  implant is a thin, plastic tube that contains a hormone. It is inserted into the upper part of the arm. It can remain in place for up to 3 years. Progestin-only injections Progestin-only injections are injections of progestin, a synthetic form of the hormone progesterone. They are given every 3 months by a health care provider. Birth control pills  Birth control pills are pills that contain hormones that prevent pregnancy. They must be taken once a day, preferably at the same time each day. Birth control patch  The birth control patch contains hormones that prevent pregnancy. It is placed on the skin and must be changed once a week for three weeks and removed on the fourth week. A prescription is needed to use this method of contraception. Vaginal ring  A vaginal ring contains hormones that prevent pregnancy. It is placed in the vagina for three weeks and removed on the fourth week. After that, the process is repeated with a new ring. A prescription is needed to use this method of contraception. Emergency contraceptive Emergency contraceptives prevent pregnancy after unprotected sex. They come in pill form and can be taken up to 5 days after sex. They work best the sooner they are taken after having sex. Most emergency contraceptives are available without a prescription. This method should not be used as your only form  of birth control. Barrier methods Female condom  A female condom is a thin sheath that is worn over the penis during sex. Condoms keep sperm from going inside a woman's body. They can be used with a spermicide to increase their effectiveness. They should be disposed after a single use. Female condom  A female condom is a soft, loose-fitting sheath that is put into the vagina before sex. The condom keeps sperm from going inside a woman's body. They should be disposed after a single use. Diaphragm  A diaphragm is a soft, dome-shaped barrier. It is inserted into the vagina before sex,  along with a spermicide. The diaphragm blocks sperm from entering the uterus, and the spermicide kills sperm. A diaphragm should be left in the vagina for 6-8 hours after sex and removed within 24 hours. A diaphragm is prescribed and fitted by a health care provider. A diaphragm should be replaced every 1-2 years, after giving birth, after gaining more than 15 lb (6.8 kg), and after pelvic surgery. Cervical cap  A cervical cap is a round, soft latex or plastic cup that fits over the cervix. It is inserted into the vagina before sex, along with spermicide. It blocks sperm from entering the uterus. The cap should be left in place for 6-8 hours after sex and removed within 48 hours. A cervical cap must be prescribed and fitted by a health care provider. It should be replaced every 2 years. Sponge  A sponge is a soft, circular piece of polyurethane foam with spermicide on it. The sponge helps block sperm from entering the uterus, and the spermicide kills sperm. To use it, you make it wet and then insert it into the vagina. It should be inserted before sex, left in for at least 6 hours after sex, and removed and thrown away within 30 hours. Spermicides Spermicides are chemicals that kill or block sperm from entering the cervix and uterus. They can come as a cream, jelly, suppository, foam, or tablet. A spermicide should be inserted into the vagina with an applicator at least 10-15 minutes before sex to allow time for it to work. The process must be repeated every time you have sex. Spermicides do not require a prescription. Intrauterine contraception Intrauterine device (IUD) An IUD is a T-shaped device that is put in a woman's uterus. There are two types:  Hormone IUD.This type contains progestin, a synthetic form of the hormone progesterone. This type can stay in place for 3-5 years.  Copper IUD.This type is wrapped in copper wire. It can stay in place for 10 years.  Permanent methods of  contraception Female tubal ligation In this method, a woman's fallopian tubes are sealed, tied, or blocked during surgery to prevent eggs from traveling to the uterus. Hysteroscopic sterilization In this method, a small, flexible insert is placed into each fallopian tube. The inserts cause scar tissue to form in the fallopian tubes and block them, so sperm cannot reach an egg. The procedure takes about 3 months to be effective. Another form of birth control must be used during those 3 months. Female sterilization This is a procedure to tie off the tubes that carry sperm (vasectomy). After the procedure, the man can still ejaculate fluid (semen). Natural planning methods Natural family planning In this method, a couple does not have sex on days when the woman could become pregnant. Calendar method This means keeping track of the length of each menstrual cycle, identifying the days when pregnancy can happen, and not  having sex on those days. Ovulation method In this method, a couple avoids sex during ovulation. Symptothermal method This method involves not having sex during ovulation. The woman typically checks for ovulation by watching changes in her temperature and in the consistency of cervical mucus. Post-ovulation method In this method, a couple waits to have sex until after ovulation. Summary  Contraception, also called birth control, means methods or devices that prevent pregnancy.  Hormonal methods of contraception include implants, injections, pills, patches, vaginal rings, and emergency contraceptives.  Barrier methods of contraception can include female condoms, female condoms, diaphragms, cervical caps, sponges, and spermicides.  There are two types of IUDs (intrauterine devices). An IUD can be put in a woman's uterus to prevent pregnancy for 3-5 years.  Permanent sterilization can be done through a procedure for males, females, or both.  Natural family planning methods involve  not having sex on days when the woman could become pregnant. This information is not intended to replace advice given to you by your health care provider. Make sure you discuss any questions you have with your health care provider. Document Released: 03/03/2005 Document Revised: 03/05/2017 Document Reviewed: 04/05/2016 Elsevier Patient Education  [place] of Pregnancy The third trimester is from week 28 through week 40 (months 7 through 9). The third trimester is a time when the unborn baby (fetus) is growing rapidly. At the end of the ninth month, the fetus is about 20 inches in length and weighs 6-10 pounds. Body changes during your third trimester Your body will continue to go through many changes during pregnancy. The changes vary from woman to woman. During the third trimester:  Your weight will continue to increase. You can expect to gain 25-35 pounds (11-16 kg) by the end of the pregnancy.  You may begin to get stretch marks on your hips, abdomen, and breasts.  You may urinate more often because the fetus is moving lower into your pelvis and pressing on your bladder.  You may develop or continue to have heartburn. This is caused by increased hormones that slow down muscles in the digestive tract.  You may develop or continue to have constipation because increased hormones slow digestion and cause the muscles that push waste through your intestines to relax.  You may develop hemorrhoids. These are swollen veins (varicose veins) in the rectum that can itch or be painful.  You may develop swollen, bulging veins (varicose veins) in your legs.  You may have increased body aches in the pelvis, back, or thighs. This is due to weight gain and increased hormones that are relaxing your joints.  You may have changes in your hair. These can include thickening of your hair, rapid growth, and changes in texture. Some women also have hair loss during or after pregnancy, or  hair that feels dry or thin. Your hair will most likely return to normal after your baby is born.  Your breasts will continue to grow and they will continue to become tender. A yellow fluid (colostrum) may leak from your breasts. This is the first milk you are producing for your baby.  Your belly button may stick out.  You may notice more swelling in your hands, face, or ankles.  You may have increased tingling or numbness in your hands, arms, and legs. The skin on your belly may also feel numb.  You may feel short of breath because of your expanding uterus.  You may have more problems sleeping. This can be caused  by the size of your belly, increased need to urinate, and an increase in your body's metabolism.  You may notice the fetus "dropping," or moving lower in your abdomen (lightening).  You may have increased vaginal discharge.  You may notice your joints feel loose and you may have pain around your pelvic bone. What to expect at prenatal visits You will have prenatal exams every 2 weeks until week 36. Then you will have weekly prenatal exams. During a routine prenatal visit:  You will be weighed to make sure you and the baby are growing normally.  Your blood pressure will be taken.  Your abdomen will be measured to track your baby's growth.  The fetal heartbeat will be listened to.  Any test results from the previous visit will be discussed.  You may have a cervical check near your due date to see if your cervix has softened or thinned (effaced).  You will be tested for Group B streptococcus. This happens between 35 and 37 weeks. Your health care provider may ask you:  What your birth plan is.  How you are feeling.  If you are feeling the baby move.  If you have had any abnormal symptoms, such as leaking fluid, bleeding, severe headaches, or abdominal cramping.  If you are using any tobacco products, including cigarettes, chewing tobacco, and electronic  cigarettes.  If you have any questions. Other tests or screenings that may be performed during your third trimester include:  Blood tests that check for low iron levels (anemia).  Fetal testing to check the health, activity level, and growth of the fetus. Testing is done if you have certain medical conditions or if there are problems during the pregnancy.  Nonstress test (NST). This test checks the health of your baby to make sure there are no signs of problems, such as the baby not getting enough oxygen. During this test, a belt is placed around your belly. The baby is made to move, and its heart rate is monitored during movement. What is false labor? False labor is a condition in which you feel small, irregular tightenings of the muscles in the womb (contractions) that usually go away with rest, changing position, or drinking water. These are called Braxton Hicks contractions. Contractions may last for hours, days, or even weeks before true labor sets in. If contractions come at regular intervals, become more frequent, increase in intensity, or become painful, you should see your health care provider. What are the signs of labor?  Abdominal cramps.  Regular contractions that start at 10 minutes apart and become stronger and more frequent with time.  Contractions that start on the top of the uterus and spread down to the lower abdomen and back.  Increased pelvic pressure and dull back pain.  A watery or bloody mucus discharge that comes from the vagina.  Leaking of amniotic fluid. This is also known as your "water breaking." It could be a slow trickle or a gush. Let your health care provider know if it has a color or strange odor. If you have any of these signs, call your health care provider right away, even if it is before your due date. Follow these instructions at home: Medicines  Follow your health care provider's instructions regarding medicine use. Specific medicines may be  either safe or unsafe to take during pregnancy.  Take a prenatal vitamin that contains at least 600 micrograms (mcg) of folic acid.  If you develop constipation, try taking a stool softener if  your health care provider approves. Eating and drinking   Eat a balanced diet that includes fresh fruits and vegetables, whole grains, good sources of protein such as meat, eggs, or tofu, and low-fat dairy. Your health care provider will help you determine the amount of weight gain that is right for you.  Avoid raw meat and uncooked cheese. These carry germs that can cause birth defects in the baby.  If you have low calcium intake from food, talk to your health care provider about whether you should take a daily calcium supplement.  Eat four or five small meals rather than three large meals a day.  Limit foods that are high in fat and processed sugars, such as fried and sweet foods.  To prevent constipation: ? Drink enough fluid to keep your urine clear or pale yellow. ? Eat foods that are high in fiber, such as fresh fruits and vegetables, whole grains, and beans. Activity  Exercise only as directed by your health care provider. Most women can continue their usual exercise routine during pregnancy. Try to exercise for 30 minutes at least 5 days a week. Stop exercising if you experience uterine contractions.  Avoid heavy lifting.  Do not exercise in extreme heat or humidity, or at high altitudes.  Wear low-heel, comfortable shoes.  Practice good posture.  You may continue to have sex unless your health care provider tells you otherwise. Relieving pain and discomfort  Take frequent breaks and rest with your legs elevated if you have leg cramps or low back pain.  Take warm sitz baths to soothe any pain or discomfort caused by hemorrhoids. Use hemorrhoid cream if your health care provider approves.  Wear a good support bra to prevent discomfort from breast tenderness.  If you develop  varicose veins: ? Wear support pantyhose or compression stockings as told by your healthcare provider. ? Elevate your feet for 15 minutes, 3-4 times a day. Prenatal care  Write down your questions. Take them to your prenatal visits.  Keep all your prenatal visits as told by your health care provider. This is important. Safety  Wear your seat belt at all times when driving.  Make a list of emergency phone numbers, including numbers for family, friends, the hospital, and police and fire departments. General instructions  Avoid cat litter boxes and soil used by cats. These carry germs that can cause birth defects in the baby. If you have a cat, ask someone to clean the litter box for you.  Do not travel far distances unless it is absolutely necessary and only with the approval of your health care provider.  Do not use hot tubs, steam rooms, or saunas.  Do not drink alcohol.  Do not use any products that contain nicotine or tobacco, such as cigarettes and e-cigarettes. If you need help quitting, ask your health care provider.  Do not use any medicinal herbs or unprescribed drugs. These chemicals affect the formation and growth of the baby.  Do not douche or use tampons or scented sanitary pads.  Do not cross your legs for long periods of time.  To prepare for the arrival of your baby: ? Take prenatal classes to understand, practice, and ask questions about labor and delivery. ? Make a trial run to the hospital. ? Visit the hospital and tour the maternity area. ? Arrange for maternity or paternity leave through employers. ? Arrange for family and friends to take care of pets while you are in the hospital. ? Purchase a  rear-facing car seat and make sure you know how to install it in your car. ? Pack your hospital bag. ? Prepare the babys nursery. Make sure to remove all pillows and stuffed animals from the baby's crib to prevent suffocation.  Visit your dentist if you have not  gone during your pregnancy. Use a soft toothbrush to brush your teeth and be gentle when you floss. Contact a health care provider if:  You are unsure if you are in labor or if your water has broken.  You become dizzy.  You have mild pelvic cramps, pelvic pressure, or nagging pain in your abdominal area.  You have lower back pain.  You have persistent nausea, vomiting, or diarrhea.  You have an unusual or bad smelling vaginal discharge.  You have pain when you urinate. Get help right away if:  Your water breaks before 37 weeks.  You have regular contractions less than 5 minutes apart before 37 weeks.  You have a fever.  You are leaking fluid from your vagina.  You have spotting or bleeding from your vagina.  You have severe abdominal pain or cramping.  You have rapid weight loss or weight gain.  You have shortness of breath with chest pain.  You notice sudden or extreme swelling of your face, hands, ankles, feet, or legs.  Your baby makes fewer than 10 movements in 2 hours.  You have severe headaches that do not go away when you take medicine.  You have vision changes. Summary  The third trimester is from week 28 through week 40, months 7 through 9. The third trimester is a time when the unborn baby (fetus) is growing rapidly.  During the third trimester, your discomfort may increase as you and your baby continue to gain weight. You may have abdominal, leg, and back pain, sleeping problems, and an increased need to urinate.  During the third trimester your breasts will keep growing and they will continue to become tender. A yellow fluid (colostrum) may leak from your breasts. This is the first milk you are producing for your baby.  False labor is a condition in which you feel small, irregular tightenings of the muscles in the womb (contractions) that eventually go away. These are called Braxton Hicks contractions. Contractions may last for hours, days, or even weeks  before true labor sets in.  Signs of labor can include: abdominal cramps; regular contractions that start at 10 minutes apart and become stronger and more frequent with time; watery or bloody mucus discharge that comes from the vagina; increased pelvic pressure and dull back pain; and leaking of amniotic fluid. This information is not intended to replace advice given to you by your health care provider. Make sure you discuss any questions you have with your health care provider. Document Released: 02/25/2001 Document Revised: 06/24/2018 Document Reviewed: 04/08/2016 Elsevier Patient Education  2020 [employer].

## 2018-12-03 NOTE — Progress Notes (Signed)
.     PRENATAL VISIT NOTE  Subjective:  Kiara Romero is a 33 y.o. G2P1001 at [redacted]w[redacted]d being seen today for ongoing prenatal care.  She is currently monitored for the following issues for this high-risk pregnancy and has Acute pain of right knee; Supervision of other normal pregnancy, antepartum; History of C-section; Elevated blood pressure reading without diagnosis of hypertension; and Obesity affecting pregnancy on their problem list.  Patient reports occ pressure in lower pelvis. .  Contractions: Not present. Vag. Bleeding: None.  Movement: Present. Denies leaking of fluid.   The following portions of the patient's history were reviewed and updated as appropriate: allergies, current medications, past family history, past medical history, past social history, past surgical history and problem list.   Objective:  BP (!) 124/55   Pulse 88   Wt 270 lb (122.5 kg)   LMP 05/18/2018 (Exact Date)   BMI 47.83 kg/m   Fetal Status: Fetal Heart Rate (bpm): 140   Movement: Present     General:  Alert, oriented and cooperative. Patient is in no acute distress.  Skin: Skin is warm and dry. No rash noted.   Cardiovascular: Normal heart rate noted  Respiratory: Normal respiratory effort, no problems with respiration noted  Abdomen: Soft, gravid, appropriate for gestational age.  Pain/Pressure: Absent     Pelvic: Cervical exam deferred        Extremities: Normal range of motion.  Edema: Trace  Mental Status: Normal mood and affect. Normal behavior. Normal judgment and thought content.   Assessment and Plan:  Pregnancy: G2P1001 at [redacted]w[redacted]d 1. Supervision of other normal pregnancy, antepartum No problems. FH> dates.   2. Obesity affecting pregnancy, antepartum 11/19/2018 Est. FW:     900  gm           2 lb     28  %  3. History of C-section VBAC consent signed today  4. Elevated blood pressure reading without diagnosis of hypertension Initial BP elevated, all other BPs WNL   Preterm labor symptoms  and general obstetric precautions including but not limited to vaginal bleeding, contractions, leaking of fluid and fetal movement were reviewed in detail with the patient. Please refer to After Visit Summary for other counseling recommendations.   Return in about 2 weeks (around 12/17/2018).  Future Appointments  Date Time Provider [place]  12/31/2018  8:15 AM [place] [facility]  12/31/2018  8:15 AM [place] 4 WH-MFCUS MFC-US    Lavonia Drafts, MD

## 2018-12-21 ENCOUNTER — Ambulatory Visit (INDEPENDENT_AMBULATORY_CARE_PROVIDER_SITE_OTHER): Payer: No Typology Code available for payment source | Admitting: Advanced Practice Midwife

## 2018-12-21 ENCOUNTER — Other Ambulatory Visit: Payer: Self-pay

## 2018-12-21 ENCOUNTER — Encounter: Payer: Self-pay | Admitting: Advanced Practice Midwife

## 2018-12-21 DIAGNOSIS — R030 Elevated blood-pressure reading, without diagnosis of hypertension: Secondary | ICD-10-CM

## 2018-12-21 DIAGNOSIS — Z3A31 31 weeks gestation of pregnancy: Secondary | ICD-10-CM

## 2018-12-21 DIAGNOSIS — Z3483 Encounter for supervision of other normal pregnancy, third trimester: Secondary | ICD-10-CM

## 2018-12-21 DIAGNOSIS — Z98891 History of uterine scar from previous surgery: Secondary | ICD-10-CM

## 2018-12-21 DIAGNOSIS — Z3480 Encounter for supervision of other normal pregnancy, unspecified trimester: Secondary | ICD-10-CM

## 2018-12-21 NOTE — Progress Notes (Signed)
Patient agreed to webex visit.  Will call back patient once provider ready. Kathrene Alu RN

## 2018-12-21 NOTE — Progress Notes (Signed)
error 

## 2018-12-21 NOTE — Progress Notes (Signed)
  I connected with Kiara Romero on 12/21/18 at 10:30 AM EDT by: Webex video and verified that I am speaking with the correct person using two identifiers.  Patient is located at her home and provider is located at office ([facility])     The purpose of this virtual visit is to provide medical care while limiting exposure to the novel coronavirus. I discussed the limitations, risks, security and privacy concerns of performing an evaluation and management service by video and the availability of in person appointments. I also discussed with the patient that there may be a patient responsible charge related to this service. By engaging in this virtual visit, you consent to the provision of healthcare.  Additionally, you authorize for your insurance to be billed for the services provided during this visit.  The patient expressed understanding and agreed to proceed.  The following staff members participated in the virtual visit:  Kathrene Alu prior to my visit    PRENATAL VISIT NOTE  Subjective:  Kiara Romero is a 33 y.o. G2P1001 at [redacted]w[redacted]d  for phone visit for ongoing prenatal care.  She is currently monitored for the following issues for this high risk pregnancy and has Acute pain of right knee; Supervision of other normal pregnancy, antepartum; History of C-section; Elevated blood pressure reading without diagnosis of hypertension; and Obesity affecting pregnancy on their problem list.  Patient reports no complaints and BPs within normal limits.  Contractions: Not present. Vag. Bleeding: None.  Movement: Present. Denies leaking of fluid.   The following portions of the patient's history were reviewed and updated as appropriate: allergies, current medications, past family history, past medical history, past social history, past surgical history and problem list.   Objective:  There were no vitals filed for this visit. Self-Obtained  Fetal Status:     Movement: Present     Assessment and Plan:  1.   Pregnancy: G2P1001 at [redacted]w[redacted]d     Doing well, No contractions, baby moving well  2.  Gestational Hypertension     Taking baby ASA     BPs in excellent control with no meds     Plan IOL at 39 weeks  3.   Previous C/S     Plans VBAC  Preterm labor symptoms and general obstetric precautions including but not limited to vaginal bleeding, contractions, leaking of fluid and fetal movement were reviewed in detail with the patient.  Return in about 2 weeks (around 01/04/2019) for TELEHEALTH VISIT.  Future Appointments  Date Time Provider [place]  12/31/2018  8:15 AM [place] [facility]  12/31/2018  8:15 AM [place] 4 WH-MFCUS M[facility]-US     Time spent on virtual visit: 10 minutes  Hansel Feinstein, CNM

## 2018-12-21 NOTE — Patient Instructions (Signed)

## 2018-12-31 ENCOUNTER — Ambulatory Visit (HOSPITAL_COMMUNITY)
Admission: RE | Admit: 2018-12-31 | Discharge: 2018-12-31 | Disposition: A | Discharge status: Home / Self Care | Payer: No Typology Code available for payment source | Source: Ambulatory Visit | Attending: Obstetrics | Admitting: Obstetrics

## 2018-12-31 ENCOUNTER — Other Ambulatory Visit (HOSPITAL_COMMUNITY): Payer: Self-pay | Admitting: *Deleted

## 2018-12-31 ENCOUNTER — Other Ambulatory Visit: Payer: Self-pay

## 2018-12-31 ENCOUNTER — Encounter (HOSPITAL_COMMUNITY): Payer: Self-pay | Admitting: *Deleted

## 2018-12-31 ENCOUNTER — Ambulatory Visit (HOSPITAL_COMMUNITY): Payer: No Typology Code available for payment source | Admitting: *Deleted

## 2018-12-31 DIAGNOSIS — O09513 Supervision of elderly primigravida, third trimester: Secondary | ICD-10-CM | POA: Diagnosis not present

## 2018-12-31 DIAGNOSIS — O09529 Supervision of elderly multigravida, unspecified trimester: Secondary | ICD-10-CM | POA: Diagnosis not present

## 2018-12-31 DIAGNOSIS — Z362 Encounter for other antenatal screening follow-up: Secondary | ICD-10-CM

## 2018-12-31 DIAGNOSIS — Z3480 Encounter for supervision of other normal pregnancy, unspecified trimester: Secondary | ICD-10-CM | POA: Diagnosis present

## 2018-12-31 DIAGNOSIS — O99213 Obesity complicating pregnancy, third trimester: Secondary | ICD-10-CM | POA: Diagnosis not present

## 2018-12-31 DIAGNOSIS — Z3A32 32 weeks gestation of pregnancy: Secondary | ICD-10-CM

## 2018-12-31 DIAGNOSIS — O133 Gestational [pregnancy-induced] hypertension without significant proteinuria, third trimester: Secondary | ICD-10-CM | POA: Diagnosis not present

## 2018-12-31 DIAGNOSIS — O34219 Maternal care for unspecified type scar from previous cesarean delivery: Secondary | ICD-10-CM

## 2019-01-04 ENCOUNTER — Ambulatory Visit (INDEPENDENT_AMBULATORY_CARE_PROVIDER_SITE_OTHER): Payer: No Typology Code available for payment source | Admitting: Advanced Practice Midwife

## 2019-01-04 ENCOUNTER — Encounter: Payer: Self-pay | Admitting: Advanced Practice Midwife

## 2019-01-04 DIAGNOSIS — R030 Elevated blood-pressure reading, without diagnosis of hypertension: Secondary | ICD-10-CM

## 2019-01-04 DIAGNOSIS — O34219 Maternal care for unspecified type scar from previous cesarean delivery: Secondary | ICD-10-CM

## 2019-01-04 DIAGNOSIS — Z3A33 33 weeks gestation of pregnancy: Secondary | ICD-10-CM

## 2019-01-04 DIAGNOSIS — Z3480 Encounter for supervision of other normal pregnancy, unspecified trimester: Secondary | ICD-10-CM

## 2019-01-04 DIAGNOSIS — Z98891 History of uterine scar from previous surgery: Secondary | ICD-10-CM

## 2019-01-04 NOTE — Patient Instructions (Signed)

## 2019-01-04 NOTE — Progress Notes (Signed)
Patient agrees to webex visit today. Kathrene Alu RN

## 2019-01-04 NOTE — Progress Notes (Signed)
I connected with patient on 01/04/19 at 10:30 AM EDT by: Webex Video and verified that I am speaking with the correct person using two identifiers.  Patient is located at home and provider is located at office.     The purpose of this virtual visit is to provide medical care while limiting exposure to the novel coronavirus. I discussed the limitations, risks, security and privacy concerns of performing an evaluation and management service by  and the availability of in person appointments. I also discussed with the patient that there may be a patient responsible charge related to this service. By engaging in this virtual visit, you consent to the provision of healthcare.  Additionally, you authorize for your insurance to be billed for the services provided during this visit.  The patient expressed understanding and agreed to proceed.  The following staff members participated in the virtual visit:  Kathrene Alu RN    PRENATAL VISIT NOTE  Subjective:  Kiara Romero is a 33 y.o. G2P1001 at [redacted]w[redacted]d  for phone visit for ongoing prenatal care.  She is currently monitored for the following issues for this high-risk pregnancy and has Acute pain of right knee; Supervision of other normal pregnancy, antepartum; History of C-section; Elevated blood pressure reading without diagnosis of hypertension; and Obesity affecting pregnancy on their problem list.  Patient reports no complaints.  Contractions: Irritability.  .  Movement: Present. Denies leaking of fluid.   States most BPs are in the 120-130s/80s.  Initial BP today was 143/80 but she was rushing to get visit started. Will retake it after visit.  No headaches or visual changes  The following portions of the patient's history were reviewed and updated as appropriate: allergies, current medications, past family history, past medical history, past social history, past surgical history and problem list.   Objective:  There were no vitals filed for this  visit. Self-Obtained  Fetal Status:     Movement: Present     Assessment and Plan:  Pregnancy: G2P1001 at [redacted]w[redacted]d  Patient Active Problem List   Diagnosis Date Noted  . Supervision of other normal pregnancy, antepartum 08/11/2018  . History of C-section 08/11/2018  . Elevated blood pressure reading without diagnosis of hypertension 08/11/2018  . Obesity affecting pregnancy 08/11/2018  . Acute pain of right knee 05/15/2017   Discussed normal and abnormal BP parameters and signs of preeclampsia Pt to call back if BP is 007 systolic or 90 diastolic on retake today Reviewed what to go to MAU for  Preterm labor symptoms and general obstetric precautions including but not limited to vaginal bleeding, contractions, leaking of fluid and fetal movement were reviewed in detail with the patient.  Return in about 2 weeks (around 01/18/2019) for [employer].  Plan cultures at next visit  Future Appointments  Date Time Provider [place]  01/28/2019  8:15 AM [place] [facility]  01/28/2019  8:15 AM [place] 4 WH-MFCUS MFC-US     Time spent on virtual visit: 8 minutes  Hansel Feinstein, CNM

## 2019-01-19 ENCOUNTER — Other Ambulatory Visit: Payer: Self-pay

## 2019-01-19 ENCOUNTER — Encounter: Payer: Self-pay | Admitting: Obstetrics & Gynecology

## 2019-01-19 ENCOUNTER — Ambulatory Visit (INDEPENDENT_AMBULATORY_CARE_PROVIDER_SITE_OTHER): Payer: No Typology Code available for payment source | Admitting: Obstetrics & Gynecology

## 2019-01-19 VITALS — BP 128/55 | HR 94 | Wt 274.0 lb

## 2019-01-19 DIAGNOSIS — Z98891 History of uterine scar from previous surgery: Secondary | ICD-10-CM

## 2019-01-19 DIAGNOSIS — O99213 Obesity complicating pregnancy, third trimester: Secondary | ICD-10-CM

## 2019-01-19 DIAGNOSIS — O99210 Obesity complicating pregnancy, unspecified trimester: Secondary | ICD-10-CM

## 2019-01-19 DIAGNOSIS — Z3480 Encounter for supervision of other normal pregnancy, unspecified trimester: Secondary | ICD-10-CM

## 2019-01-19 DIAGNOSIS — R030 Elevated blood-pressure reading, without diagnosis of hypertension: Secondary | ICD-10-CM

## 2019-01-19 DIAGNOSIS — Z3A35 35 weeks gestation of pregnancy: Secondary | ICD-10-CM

## 2019-01-19 NOTE — Progress Notes (Signed)
   PRENATAL VISIT NOTE  Subjective:  Kiara Romero is a 33 y.o. G2P1001 at [redacted]w[redacted]d being seen today for ongoing prenatal care.  She is currently monitored for the following issues for this high-risk pregnancy and has Acute pain of right knee; Supervision of other normal pregnancy, antepartum; History of C-section; Elevated blood pressure reading without diagnosis of hypertension; and Obesity affecting pregnancy on their problem list.  Patient reports worsening carpel tunnel..  Contractions: Irregular. Vag. Bleeding: None.  Movement: Present. Denies leaking of fluid.   The following portions of the patient's history were reviewed and updated as appropriate: allergies, current medications, past family history, past medical history, past social history, past surgical history and problem list.   Objective:   Vitals:   01/19/19 1407  BP: (!) 128/55  Pulse: 94  Weight: 274 lb (124.3 kg)    Fetal Status: Fetal Heart Rate (bpm): 151   Movement: Present     General:  Alert, oriented and cooperative. Patient is in no acute distress.  Skin: Skin is warm and dry. No rash noted.   Cardiovascular: Normal heart rate noted  Respiratory: Normal respiratory effort, no problems with respiration noted  Abdomen: Soft, gravid, appropriate for gestational age.  Pain/Pressure: Present     Pelvic: Cervical exam deferred        Extremities: Normal range of motion.  Edema: None  Mental Status: Normal mood and affect. Normal behavior. Normal judgment and thought content.   Assessment and Plan:  Pregnancy: G2P1001 at [redacted]w[redacted]d 1. Supervision of other normal pregnancy, antepartum GBS and cx next visit  2. Obesity affecting pregnancy, antepartum 12/31/2018 Est. FW:    2253  gm    4 lb 15 oz      79  % 3. History of C-section Desires VBAC  4. Elevated blood pressure reading without diagnosis of hypertension BP WNL  Preterm labor symptoms and general obstetric precautions including but not limited to vaginal  bleeding, contractions, leaking of fluid and fetal movement were reviewed in detail with the patient. Please refer to After Visit Summary for other counseling recommendations.   No follow-ups on file.  Future Appointments  Date Time Provider [place]  01/28/2019  8:15 AM [place] [facility]  01/28/2019  8:15 AM [place] 4 WH-MFCUS MFC-US    Lavonia Drafts, MD

## 2019-01-21 ENCOUNTER — Encounter: Payer: No Typology Code available for payment source | Admitting: Family Medicine

## 2019-01-28 ENCOUNTER — Other Ambulatory Visit: Payer: Self-pay

## 2019-01-28 ENCOUNTER — Encounter (HOSPITAL_COMMUNITY): Payer: Self-pay

## 2019-01-28 ENCOUNTER — Ambulatory Visit (HOSPITAL_COMMUNITY)
Admission: RE | Admit: 2019-01-28 | Discharge: 2019-01-28 | Disposition: A | Discharge status: Home / Self Care | Payer: No Typology Code available for payment source | Source: Ambulatory Visit | Attending: Obstetrics | Admitting: Obstetrics

## 2019-01-28 ENCOUNTER — Other Ambulatory Visit (HOSPITAL_COMMUNITY): Payer: Self-pay | Admitting: *Deleted

## 2019-01-28 ENCOUNTER — Ambulatory Visit (HOSPITAL_COMMUNITY): Payer: No Typology Code available for payment source | Admitting: *Deleted

## 2019-01-28 DIAGNOSIS — Z362 Encounter for other antenatal screening follow-up: Secondary | ICD-10-CM | POA: Diagnosis not present

## 2019-01-28 DIAGNOSIS — O34219 Maternal care for unspecified type scar from previous cesarean delivery: Secondary | ICD-10-CM

## 2019-01-28 DIAGNOSIS — O133 Gestational [pregnancy-induced] hypertension without significant proteinuria, third trimester: Secondary | ICD-10-CM

## 2019-01-28 DIAGNOSIS — O99213 Obesity complicating pregnancy, third trimester: Secondary | ICD-10-CM

## 2019-01-28 DIAGNOSIS — Z3480 Encounter for supervision of other normal pregnancy, unspecified trimester: Secondary | ICD-10-CM | POA: Diagnosis present

## 2019-01-28 DIAGNOSIS — O09513 Supervision of elderly primigravida, third trimester: Secondary | ICD-10-CM | POA: Diagnosis not present

## 2019-01-28 DIAGNOSIS — O09529 Supervision of elderly multigravida, unspecified trimester: Secondary | ICD-10-CM | POA: Diagnosis present

## 2019-01-28 DIAGNOSIS — Z3A36 36 weeks gestation of pregnancy: Secondary | ICD-10-CM

## 2019-01-31 ENCOUNTER — Other Ambulatory Visit: Payer: Self-pay

## 2019-01-31 ENCOUNTER — Encounter (HOSPITAL_COMMUNITY): Payer: Self-pay

## 2019-01-31 ENCOUNTER — Inpatient Hospital Stay (HOSPITAL_COMMUNITY)
Admission: AD | Admit: 2019-01-31 | Discharge: 2019-01-31 | Disposition: A | Discharge status: Home / Self Care | Payer: No Typology Code available for payment source | Attending: Obstetrics and Gynecology | Admitting: Obstetrics and Gynecology

## 2019-01-31 ENCOUNTER — Ambulatory Visit (INDEPENDENT_AMBULATORY_CARE_PROVIDER_SITE_OTHER): Payer: No Typology Code available for payment source | Admitting: Family Medicine

## 2019-01-31 ENCOUNTER — Telehealth: Payer: Self-pay

## 2019-01-31 ENCOUNTER — Telehealth (HOSPITAL_COMMUNITY): Payer: Self-pay | Admitting: *Deleted

## 2019-01-31 VITALS — BP 132/63 | HR 107 | Wt 278.0 lb

## 2019-01-31 DIAGNOSIS — Z113 Encounter for screening for infections with a predominantly sexual mode of transmission: Secondary | ICD-10-CM

## 2019-01-31 DIAGNOSIS — O26893 Other specified pregnancy related conditions, third trimester: Secondary | ICD-10-CM | POA: Diagnosis not present

## 2019-01-31 DIAGNOSIS — O09523 Supervision of elderly multigravida, third trimester: Secondary | ICD-10-CM | POA: Diagnosis not present

## 2019-01-31 DIAGNOSIS — R109 Unspecified abdominal pain: Secondary | ICD-10-CM | POA: Diagnosis not present

## 2019-01-31 DIAGNOSIS — O99210 Obesity complicating pregnancy, unspecified trimester: Secondary | ICD-10-CM

## 2019-01-31 DIAGNOSIS — Z3A36 36 weeks gestation of pregnancy: Secondary | ICD-10-CM | POA: Diagnosis not present

## 2019-01-31 DIAGNOSIS — Z3480 Encounter for supervision of other normal pregnancy, unspecified trimester: Secondary | ICD-10-CM

## 2019-01-31 DIAGNOSIS — N368 Other specified disorders of urethra: Secondary | ICD-10-CM | POA: Diagnosis not present

## 2019-01-31 DIAGNOSIS — O99213 Obesity complicating pregnancy, third trimester: Secondary | ICD-10-CM

## 2019-01-31 DIAGNOSIS — R300 Dysuria: Secondary | ICD-10-CM | POA: Diagnosis present

## 2019-01-31 DIAGNOSIS — Z98891 History of uterine scar from previous surgery: Secondary | ICD-10-CM

## 2019-01-31 LAB — URINALYSIS, ROUTINE W REFLEX MICROSCOPIC
Bilirubin Urine: NEGATIVE
Glucose, UA: NEGATIVE mg/dL
Ketones, ur: NEGATIVE mg/dL
Nitrite: NEGATIVE
Protein, ur: NEGATIVE mg/dL
Specific Gravity, Urine: 1.009 (ref 1.005–1.030)
pH: 6 (ref 5.0–8.0)

## 2019-01-31 MED ORDER — LIDOCAINE HCL URETHRAL/MUCOSAL 2 % EX GEL: 1.0000 "application " | CUTANEOUS | 0 refills | Status: DC | PRN

## 2019-01-31 MED ORDER — LIDOCAINE HCL URETHRAL/MUCOSAL 2 % EX GEL
1.0000 "application " | Freq: Once | CUTANEOUS | Status: AC
  Administered 2019-01-31: 1 via URETHRAL
  Filled 2019-01-31: qty 5

## 2019-01-31 NOTE — Progress Notes (Signed)
   PRENATAL VISIT NOTE  Subjective:  Kiara Romero is a 33 y.o. G2P1001 at [redacted]w[redacted]d being seen today for ongoing prenatal care.  She is currently monitored for the following issues for this high-risk pregnancy and has Acute pain of right knee; Supervision of other normal pregnancy, antepartum; History of C-section; Elevated blood pressure reading without diagnosis of hypertension; and Obesity affecting pregnancy on their problem list.  Patient reports no complaints.  Contractions: Irritability.  .  Movement: Present. Denies leaking of fluid.   The following portions of the patient's history were reviewed and updated as appropriate: allergies, current medications, past family history, past medical history, past social history, past surgical history and problem list.   Objective:   Vitals:   01/31/19 0905  BP: 132/63  Pulse: (!) 107  Weight: 278 lb (126.1 kg)    Fetal Status: Fetal Heart Rate (bpm): 156   Movement: Present     General:  Alert, oriented and cooperative. Patient is in no acute distress.  Skin: Skin is warm and dry. No rash noted.   Cardiovascular: Normal heart rate noted  Respiratory: Normal respiratory effort, no problems with respiration noted  Abdomen: Soft, gravid, appropriate for gestational age.  Pain/Pressure: Absent     Pelvic: Cervical exam deferred        Extremities: Normal range of motion.  Edema: Trace  Mental Status: Normal mood and affect. Normal behavior. Normal judgment and thought content.   Assessment and Plan:  Pregnancy: G2P1001 at [redacted]w[redacted]d  1. Supervision of other normal pregnancy, antepartum FHT and FH normal. BP normal. Had a few spuriously elevated BPs, but no diagnosis of GHTN.  2. Obesity affecting pregnancy, antepartum EFW 7#  3. History of C-section Rpt c/s desired. Will schedule.   Preterm labor symptoms and general obstetric precautions including but not limited to vaginal bleeding, contractions, leaking of fluid and fetal movement  were reviewed in detail with the patient. Please refer to After Visit Summary for other counseling recommendations.   No follow-ups on file.  Future Appointments  Date Time Provider [place]  02/04/2019  9:45 AM [place] [facility]  02/04/2019  9:45 AM [place] 5 WH-MFCUS MFC-US    Truett Mainland, DO

## 2019-01-31 NOTE — Progress Notes (Signed)
Patient would like to discuss having a c-section.

## 2019-01-31 NOTE — MAU Note (Signed)
Pt had vaginal swabs (GBS&GC/CHL)  at office this morning. After swabs, she notice pink bleeding when she urinates. Also says she feels urethra is painful upon urination. Had some cramps but they have subsided. +FM

## 2019-01-31 NOTE — Telephone Encounter (Signed)
Pt called the nurse line stating she has her GC/Chlamydia and GBS swabs done today and noticed blood when she wiped. Pt states that she went to the bathroom twice and noticed blood in the toilet. Pt advised to go to [facility] if she starts to bleed heavy like a period or if she notices leakage of fluids. Understanding was voiced.  Kiara Romero, CMA

## 2019-01-31 NOTE — MAU Provider Note (Signed)
History     CSN: [id number]  Arrival date and time: 01/31/19 1830   First Provider Initiated Contact with Patient 01/31/19 1912      Chief Complaint  Patient presents with  . Dysuria   HPI  Ms.  Kiara Romero is a 33 y.o. year old G54P1001 female at [redacted]w[redacted]d weeks gestation who presents to MAU reporting urethral pain, bleeding after GBS, GC/CT swabs that were done at 0900 today. She reports that she had pinkish-red bleeding after urinating and dysuria. She also reports abdominal cramping earlier that has subsided by now. She states her urethra is in pain and hurts to touch. She reports (+) FM. She has tried taking ES Tylenol with no relief.   Past Medical History:  Diagnosis Date  . Anxiety     Past Surgical History:  Procedure Laterality Date  . CESAREAN SECTION  2005    Family History  Problem Relation Age of Onset  . Diabetes Mother   . Hyperlipidemia Mother   . Hypertension Mother   . Depression Mother   . Hyperlipidemia Father   . Heart disease Father   . Drug abuse Father   . Depression Brother   . Hypertension Brother   . Anxiety disorder Brother     Social History   Tobacco Use  . Smoking status: Never Smoker  . Smokeless tobacco: Never Used  Substance Use Topics  . Alcohol use: No  . Drug use: No    Allergies: No Known Allergies  No medications prior to admission.    Review of Systems  Constitutional: Negative.   HENT: Negative.   Eyes: Negative.   Respiratory: Negative.   Cardiovascular: Negative.   Gastrointestinal: Negative.   Endocrine: Negative.   Genitourinary: Positive for dysuria and hematuria.  Musculoskeletal: Negative.   Skin: Negative.   Allergic/Immunologic: Negative.   Neurological: Negative.   Hematological: Negative.   Psychiatric/Behavioral: Negative.    Physical Exam   Blood pressure 137/64, pulse 100, temperature 98.4 F (36.9 C), temperature source Oral, resp. rate 16, height 5\' 3"  (1.6 m), weight 125.6 kg, last  menstrual period 05/18/2018, SpO2 99 %.  Physical Exam  Nursing note and vitals reviewed. Constitutional: She is oriented to person, place, and time. She appears well-developed and well-nourished.  HENT:  Head: Normocephalic and atraumatic.  Eyes: Pupils are equal, round, and reactive to light.  Neck: Normal range of motion.  Cardiovascular: Normal rate and regular rhythm.  Respiratory: Effort normal.  GI: Soft.  Genitourinary:    Genitourinary Comments: Speculum exam: no vaginal bleeding or evidence of vaginal bleeding Visual inspection of urethral meatus: moderately swollen, painful to touch and small blood clot in the meatus   Musculoskeletal: Normal range of motion.  Neurological: She is alert and oriented to person, place, and time.  Skin: Skin is warm and dry.  Psychiatric: She has a normal mood and affect. Her behavior is normal. Judgment and thought content normal.    MAU Course  Procedures  MDM Speculum Exam Lidocaine Jelly to urethra Ice pack to perineum  *Consult with Dr. 07/18/2018 @ [phone] - notified of patient's complaints, assessments, lab & U/S results, tx plan apply lidocaine jelly and ice to affected area - ok to d/c home, agrees with plan  Assessment and Plan  Pain in urethral meatus - Rx for Lidocaine jelly 2% 5 ml sent to pharmacy - Apply lidocaine jelly 2% to affected area prn pain - Demonstration given to husband on application of lidocaine to urethra -  he verbalized an understanding of application of medication to urethra and willing to complete for patient. - Apologies given for the accidental trauma she sustained from GC/CT swab in her urethra - Advised that urethra will heal quickly without damage  Urethral bleeding  - Advised that bleeding should improve - Keep ice on area to reduce swelling and bleeding  - Discharge patient - Keep scheduled appt with [facility] on 02/04/19 - Patient verbalized an understanding of the plan of care and agrees.      Laury Deep, MSN, CNM 01/31/2019, 7:12 PM

## 2019-01-31 NOTE — Telephone Encounter (Signed)
Preadmission screen  

## 2019-01-31 NOTE — Discharge Instructions (Signed)
Apply the lidocaine jelly to your urethra at least 2- 3 times per day. Take Tylenol 1000 mg every 6 hours as needed for pain.

## 2019-01-31 NOTE — Patient Instructions (Signed)
MONIFAH [place]HLING  01/31/2019   Your procedure is scheduled on:  02/15/2019  Arrive at [address] at [facility] [address] on [address] at [facility]  and [address]. You are invited to use the FREE valet parking or use the Visitor's parking deck.  Pick up the phone at the desk and dial [phone].  Call this number if you have problems the morning of surgery: [phone]  Remember:   Do not eat food:(After Midnight) Desps de medianoche.  Do not drink clear liquids: (After Midnight) Desps de medianoche.  Take these medicines the morning of surgery with A SIP OF WATER:  none   Do not wear jewelry, make-up or nail polish.  Do not wear lotions, powders, or perfumes. Do not wear deodorant.  Do not shave 48 hours prior to surgery.  Do not bring valuables to the hospital.  [facility] is not   responsible for any belongings or valuables brought to the hospital.  Contacts, dentures or bridgework may not be worn into surgery.  Leave suitcase in the car. After surgery it may be brought to your room.  For patients admitted to the hospital, checkout time is 11:00 AM the day of              discharge.      Please read over the following fact sheets that you were given:     Preparing for Surgery

## 2019-02-01 ENCOUNTER — Encounter (HOSPITAL_COMMUNITY): Payer: Self-pay

## 2019-02-01 LAB — GC/CHLAMYDIA PROBE AMP (~~LOC~~) NOT AT ARMC
Chlamydia: NEGATIVE
Comment: NEGATIVE
Comment: NORMAL
Neisseria Gonorrhea: NEGATIVE

## 2019-02-01 NOTE — MAU Provider Note (Signed)
History     CSN: [id number]  Arrival date and time: 01/31/19 1830   First Provider Initiated Contact with Patient 01/31/19 1912      Chief Complaint  Patient presents with  . Dysuria   Dysuria  Associated symptoms include hematuria.    Ms.  Kiara Romero is a 33 y.o. year old G26P1001 female at [redacted]w[redacted]d weeks gestation who presents to MAU reporting urethral pain, bleeding after GBS, GC/CT swabs that were done at 0900 today. She reports that she had pinkish-red bleeding after urinating and dysuria. She also reports abdominal cramping earlier that has subsided by now. She states her urethra is in pain and hurts to touch. She reports (+) FM. She has tried taking ES Tylenol with no relief.   Past Medical History:  Diagnosis Date  . Anxiety     Past Surgical History:  Procedure Laterality Date  . CESAREAN SECTION  2005    Family History  Problem Relation Age of Onset  . Diabetes Mother   . Hyperlipidemia Mother   . Hypertension Mother   . Depression Mother   . Hyperlipidemia Father   . Heart disease Father   . Drug abuse Father   . Depression Brother   . Hypertension Brother   . Anxiety disorder Brother     Social History   Tobacco Use  . Smoking status: Never Smoker  . Smokeless tobacco: Never Used  Substance Use Topics  . Alcohol use: No  . Drug use: No    Allergies: No Known Allergies  No medications prior to admission.    Review of Systems  Constitutional: Negative.   HENT: Negative.   Eyes: Negative.   Respiratory: Negative.   Cardiovascular: Negative.   Gastrointestinal: Negative.   Endocrine: Negative.   Genitourinary: Positive for dysuria and hematuria.  Musculoskeletal: Negative.   Skin: Negative.   Allergic/Immunologic: Negative.   Neurological: Negative.   Hematological: Negative.   Psychiatric/Behavioral: Negative.    Physical Exam   Blood pressure 137/64, pulse 100, temperature 98.4 F (36.9 C), temperature source Oral, resp. rate  16, height 5\' 3"  (1.6 m), weight 125.6 kg, last menstrual period 05/18/2018, SpO2 99 %.  Physical Exam  Nursing note and vitals reviewed. Constitutional: She is oriented to person, place, and time. She appears well-developed and well-nourished.  HENT:  Head: Normocephalic and atraumatic.  Eyes: Pupils are equal, round, and reactive to light.  Neck: Normal range of motion.  Cardiovascular: Normal rate and regular rhythm.  Respiratory: Effort normal.  GI: Soft.  Genitourinary:    Genitourinary Comments: Speculum exam: no vaginal bleeding or evidence of vaginal bleeding Visual inspection of urethral meatus: moderately swollen, painful to touch and small blood clot in the meatus   Musculoskeletal: Normal range of motion.  Neurological: She is alert and oriented to person, place, and time.  Skin: Skin is warm and dry.  Psychiatric: She has a normal mood and affect. Her behavior is normal. Judgment and thought content normal.   NST - FHR: 135 bpm / moderate variability / accels present / decels absent / TOCO: none  MAU Course  Procedures  MDM Speculum Exam Lidocaine Jelly to urethra Ice pack to perineum  *Consult with Dr. Rosana Hoes @ [phone] - notified of patient's complaints, assessments, lab & U/S results, tx plan apply lidocaine jelly and ice to affected area - ok to d/c home, agrees with plan  Assessment and Plan  Pain in urethral meatus - Rx for Lidocaine jelly 2% 5 ml sent  to pharmacy - Apply lidocaine jelly 2% to affected area prn pain - Demonstration given to husband on application of lidocaine to urethra - he verbalized an understanding of application of medication to urethra and willing to complete for patient. - Apologies given for the accidental trauma she sustained from GC/CT swab in her urethra - Advised that urethra will heal quickly without damage  Urethral bleeding  - Advised that bleeding should improve - Keep ice on area to reduce swelling and bleeding  - Discharge  patient - Keep scheduled appt with [facility] on 02/04/19 - Patient verbalized an understanding of the plan of care and agrees.     Raelyn Mora, MSN, CNM 02/01/2019, 7:12 PM

## 2019-02-04 ENCOUNTER — Encounter (HOSPITAL_COMMUNITY): Payer: Self-pay

## 2019-02-04 ENCOUNTER — Ambulatory Visit (HOSPITAL_COMMUNITY): Payer: No Typology Code available for payment source | Admitting: *Deleted

## 2019-02-04 ENCOUNTER — Ambulatory Visit (HOSPITAL_COMMUNITY)
Admission: RE | Admit: 2019-02-04 | Discharge: 2019-02-04 | Disposition: A | Discharge status: Home / Self Care | Payer: No Typology Code available for payment source | Source: Ambulatory Visit | Attending: Obstetrics and Gynecology | Admitting: Obstetrics and Gynecology

## 2019-02-04 ENCOUNTER — Other Ambulatory Visit (HOSPITAL_COMMUNITY): Payer: No Typology Code available for payment source

## 2019-02-04 ENCOUNTER — Other Ambulatory Visit: Payer: Self-pay

## 2019-02-04 DIAGNOSIS — Z3480 Encounter for supervision of other normal pregnancy, unspecified trimester: Secondary | ICD-10-CM

## 2019-02-04 DIAGNOSIS — O3413 Maternal care for benign tumor of corpus uteri, third trimester: Secondary | ICD-10-CM

## 2019-02-04 DIAGNOSIS — O34219 Maternal care for unspecified type scar from previous cesarean delivery: Secondary | ICD-10-CM

## 2019-02-04 DIAGNOSIS — N368 Other specified disorders of urethra: Secondary | ICD-10-CM | POA: Diagnosis present

## 2019-02-04 DIAGNOSIS — O133 Gestational [pregnancy-induced] hypertension without significant proteinuria, third trimester: Secondary | ICD-10-CM | POA: Diagnosis not present

## 2019-02-04 DIAGNOSIS — O99213 Obesity complicating pregnancy, third trimester: Secondary | ICD-10-CM | POA: Diagnosis not present

## 2019-02-04 DIAGNOSIS — O09523 Supervision of elderly multigravida, third trimester: Secondary | ICD-10-CM | POA: Diagnosis not present

## 2019-02-04 DIAGNOSIS — Z3A37 37 weeks gestation of pregnancy: Secondary | ICD-10-CM

## 2019-02-04 LAB — CULTURE, BETA STREP (GROUP B ONLY): Strep Gp B Culture: NEGATIVE

## 2019-02-08 ENCOUNTER — Telehealth (INDEPENDENT_AMBULATORY_CARE_PROVIDER_SITE_OTHER): Payer: No Typology Code available for payment source | Admitting: Nurse Practitioner

## 2019-02-08 VITALS — BP 122/82 | Wt 284.0 lb

## 2019-02-08 DIAGNOSIS — Z98891 History of uterine scar from previous surgery: Secondary | ICD-10-CM

## 2019-02-08 DIAGNOSIS — Z3A38 38 weeks gestation of pregnancy: Secondary | ICD-10-CM

## 2019-02-08 DIAGNOSIS — Z3480 Encounter for supervision of other normal pregnancy, unspecified trimester: Secondary | ICD-10-CM

## 2019-02-08 DIAGNOSIS — Z3483 Encounter for supervision of other normal pregnancy, third trimester: Secondary | ICD-10-CM

## 2019-02-08 NOTE — Progress Notes (Signed)
I connected with@ on 02/08/19 at  8:45 AM EST by: Kathrene Alu, RN and verified that I am speaking with the correct person using two identifiers.  Patient is located at home and provider is located at [facility] office.     The purpose of this virtual visit is to provide medical care while limiting exposure to the novel coronavirus. I discussed the limitations, risks, security and privacy concerns of performing an evaluation and management service by Earlie Server, NP and the availability of in person appointments. I also discussed with the patient that there may be a patient responsible charge related to this service. By engaging in this virtual visit, you consent to the provision of healthcare.  Additionally, you authorize for your insurance to be billed for the services provided during this visit.  The patient expressed understanding and agreed to proceed.  The following staff members participated in the virtual visit:  Kathrene Alu, RN and Earlie Server, NP    PRENATAL VISIT NOTE  Subjective:  LOVELL ROE is a 33 y.o. G2P1001 at [redacted]w[redacted]d  for phone visit for ongoing prenatal care.  She is currently monitored for the following issues for this low-risk pregnancy and has Acute pain of right knee; Supervision of other normal pregnancy, antepartum; History of C-section; Elevated blood pressure reading without diagnosis of hypertension; Obesity affecting pregnancy; Urethral bleeding; and Pain in urethral meatus on their problem list.  Patient reports no complaints.  Contractions: Irritability. Vag. Bleeding: None.  Movement: Present. Denies leaking of fluid.   The following portions of the patient's history were reviewed and updated as appropriate: allergies, current medications, past family history, past medical history, past social history, past surgical history and problem list.   Objective:   Vitals:   02/08/19 0844  BP: 122/82  Weight: 284 lb (128.8 kg)   Self-Obtained  Fetal Status:      Movement: Present     Assessment and Plan:  Pregnancy: G2P1001 at [redacted]w[redacted]d 1.  Supervision of pregnancy Scheduled for repeat C/S.  Will go on Sunday for labs and C/S is on Tuesday. Reviewed urethral problem from 01-31-19  - now resolved but has some concern about catheter for C/S.  Advised to make sure her provider is aware of this problem and to notify the provider if she starts to have any pain in that area.  Term labor symptoms and general obstetric precautions including but not limited to vaginal bleeding, contractions, leaking of fluid and fetal movement were reviewed in detail with the patient.  No follow-ups on file.  Future Appointments  Date Time Provider [place]  02/13/2019  8:30 AM [facility] 1 [facility] None  03/01/2019  9:45 AM Seabron Spates, CNM [facility] None  03/28/2019  9:00 AM Lavonia Drafts, MD [facility] None     Time spent on virtual visit: 12 minutes  Virginia Rochester, NP

## 2019-02-08 NOTE — Progress Notes (Signed)
Patient agrees to My chart visit today. Patient reports good fetal movement Kathrene Alu RN

## 2019-02-13 ENCOUNTER — Other Ambulatory Visit (HOSPITAL_COMMUNITY)
Admission: RE | Admit: 2019-02-13 | Discharge: 2019-02-13 | Disposition: A | Discharge status: Home / Self Care | Payer: No Typology Code available for payment source | Source: Ambulatory Visit | Attending: Obstetrics and Gynecology | Admitting: Obstetrics and Gynecology

## 2019-02-13 ENCOUNTER — Other Ambulatory Visit: Payer: Self-pay

## 2019-02-13 DIAGNOSIS — Z01812 Encounter for preprocedural laboratory examination: Secondary | ICD-10-CM | POA: Insufficient documentation

## 2019-02-13 DIAGNOSIS — Z20828 Contact with and (suspected) exposure to other viral communicable diseases: Secondary | ICD-10-CM | POA: Insufficient documentation

## 2019-02-13 LAB — CBC
HCT: 38.3 % (ref 36.0–46.0)
Hemoglobin: 12.8 g/dL (ref 12.0–15.0)
MCH: 29.7 pg (ref 26.0–34.0)
MCHC: 33.4 g/dL (ref 30.0–36.0)
MCV: 88.9 fL (ref 80.0–100.0)
Platelets: 309 10*3/uL (ref 150–400)
RBC: 4.31 MIL/uL (ref 3.87–5.11)
RDW: 14.4 % (ref 11.5–15.5)
WBC: 6.6 10*3/uL (ref 4.0–10.5)
nRBC: 0 % (ref 0.0–0.2)

## 2019-02-13 LAB — TYPE AND SCREEN
ABO/RH(D): O POS
Antibody Screen: NEGATIVE

## 2019-02-13 LAB — SARS CORONAVIRUS 2 (TAT 6-24 HRS): SARS Coronavirus 2: NEGATIVE

## 2019-02-13 LAB — RPR: RPR Ser Ql: NONREACTIVE

## 2019-02-13 LAB — ABO/RH: ABO/RH(D): O POS

## 2019-02-13 NOTE — MAU Note (Signed)
Pt here for PAT covid swab. Denies symptoms. Swab collected. Pt verbalizes understanding to not remove blood bank bracelet.  

## 2019-02-15 ENCOUNTER — Inpatient Hospital Stay (HOSPITAL_COMMUNITY)
Admission: RE | Admit: 2019-02-15 | Discharge: 2019-02-18 | DRG: 787 | Disposition: A | Discharge status: Home / Self Care | Payer: No Typology Code available for payment source | Attending: Obstetrics and Gynecology | Admitting: Obstetrics and Gynecology

## 2019-02-15 ENCOUNTER — Encounter (HOSPITAL_COMMUNITY): Payer: Self-pay | Admitting: *Deleted

## 2019-02-15 ENCOUNTER — Inpatient Hospital Stay (HOSPITAL_COMMUNITY): Payer: No Typology Code available for payment source | Admitting: Anesthesiology

## 2019-02-15 ENCOUNTER — Other Ambulatory Visit: Payer: Self-pay

## 2019-02-15 ENCOUNTER — Encounter (HOSPITAL_COMMUNITY)
Admission: RE | Disposition: A | Discharge status: Home / Self Care | Payer: Self-pay | Source: Home / Self Care | Attending: Obstetrics and Gynecology

## 2019-02-15 DIAGNOSIS — D62 Acute posthemorrhagic anemia: Secondary | ICD-10-CM | POA: Diagnosis not present

## 2019-02-15 DIAGNOSIS — O99214 Obesity complicating childbirth: Secondary | ICD-10-CM | POA: Diagnosis present

## 2019-02-15 DIAGNOSIS — Z3A39 39 weeks gestation of pregnancy: Secondary | ICD-10-CM

## 2019-02-15 DIAGNOSIS — Z98891 History of uterine scar from previous surgery: Secondary | ICD-10-CM

## 2019-02-15 DIAGNOSIS — O26893 Other specified pregnancy related conditions, third trimester: Secondary | ICD-10-CM | POA: Diagnosis present

## 2019-02-15 DIAGNOSIS — Z3480 Encounter for supervision of other normal pregnancy, unspecified trimester: Secondary | ICD-10-CM

## 2019-02-15 DIAGNOSIS — O34211 Maternal care for low transverse scar from previous cesarean delivery: Secondary | ICD-10-CM | POA: Diagnosis present

## 2019-02-15 DIAGNOSIS — O9081 Anemia of the puerperium: Secondary | ICD-10-CM | POA: Diagnosis not present

## 2019-02-15 DIAGNOSIS — Z20828 Contact with and (suspected) exposure to other viral communicable diseases: Secondary | ICD-10-CM | POA: Diagnosis present

## 2019-02-15 DIAGNOSIS — R030 Elevated blood-pressure reading, without diagnosis of hypertension: Secondary | ICD-10-CM | POA: Diagnosis present

## 2019-02-15 SURGERY — Surgical Case
Anesthesia: Spinal

## 2019-02-15 MED ORDER — FENTANYL CITRATE (PF) 100 MCG/2ML IJ SOLN
INTRAMUSCULAR | Status: AC
  Filled 2019-02-15: qty 2

## 2019-02-15 MED ORDER — PROMETHAZINE HCL 25 MG/ML IJ SOLN: 6.2500 mg | INTRAMUSCULAR | Status: DC | PRN

## 2019-02-15 MED ORDER — NALBUPHINE HCL 10 MG/ML IJ SOLN
5.0000 mg | INTRAMUSCULAR | Status: DC | PRN
  Filled 2019-02-15: qty 0.5

## 2019-02-15 MED ORDER — SODIUM CHLORIDE 0.9 % IV SOLN
INTRAVENOUS | Status: DC | PRN
  Administered 2019-02-15: 12:00:00 via INTRAVENOUS

## 2019-02-15 MED ORDER — PHENYLEPHRINE HCL-NACL 20-0.9 MG/250ML-% IV SOLN
INTRAVENOUS | Status: AC
  Filled 2019-02-15: qty 750

## 2019-02-15 MED ORDER — SIMETHICONE 80 MG PO CHEW
80.0000 mg | CHEWABLE_TABLET | Freq: Three times a day (TID) | ORAL | Status: DC
  Administered 2019-02-16 – 2019-02-18 (×7): 80 mg via ORAL
  Filled 2019-02-15 (×7): qty 1

## 2019-02-15 MED ORDER — METOCLOPRAMIDE HCL 5 MG/ML IJ SOLN
INTRAMUSCULAR | Status: AC
  Filled 2019-02-15: qty 2

## 2019-02-15 MED ORDER — SOD CITRATE-CITRIC ACID 500-334 MG/5ML PO SOLN
30.0000 mL | ORAL | Status: AC
  Administered 2019-02-15: 30 mL via ORAL

## 2019-02-15 MED ORDER — NALOXONE HCL 0.4 MG/ML IJ SOLN: 0.4000 mg | INTRAMUSCULAR | Status: DC | PRN

## 2019-02-15 MED ORDER — COCONUT OIL OIL
1.0000 "application " | TOPICAL_OIL | Status: DC | PRN
  Administered 2019-02-17: 1 via TOPICAL

## 2019-02-15 MED ORDER — HYDROMORPHONE HCL 1 MG/ML IJ SOLN
0.2500 mg | INTRAMUSCULAR | Status: DC | PRN
  Administered 2019-02-15: 0.5 mg via INTRAVENOUS

## 2019-02-15 MED ORDER — SCOPOLAMINE 1 MG/3DAYS TD PT72
MEDICATED_PATCH | TRANSDERMAL | Status: AC
  Filled 2019-02-15: qty 1

## 2019-02-15 MED ORDER — MEPERIDINE HCL 25 MG/ML IJ SOLN
INTRAMUSCULAR | Status: AC
  Filled 2019-02-15: qty 1

## 2019-02-15 MED ORDER — HYDROCODONE-ACETAMINOPHEN 5-325 MG PO TABS
1.0000 | ORAL_TABLET | ORAL | Status: DC | PRN
  Administered 2019-02-16 (×2): 2 via ORAL
  Administered 2019-02-16 – 2019-02-17 (×2): 1 via ORAL
  Administered 2019-02-17 – 2019-02-18 (×5): 2 via ORAL
  Filled 2019-02-15 (×4): qty 2
  Filled 2019-02-15: qty 1
  Filled 2019-02-15: qty 2
  Filled 2019-02-15: qty 1
  Filled 2019-02-15 (×2): qty 2

## 2019-02-15 MED ORDER — DEXAMETHASONE SODIUM PHOSPHATE 10 MG/ML IJ SOLN
INTRAMUSCULAR | Status: AC
  Filled 2019-02-15: qty 1

## 2019-02-15 MED ORDER — HYDROMORPHONE HCL 1 MG/ML IJ SOLN
INTRAMUSCULAR | Status: AC
  Filled 2019-02-15: qty 0.5

## 2019-02-15 MED ORDER — LACTATED RINGERS IV SOLN
INTRAVENOUS | Status: DC
  Administered 2019-02-15: 22:00:00 via INTRAVENOUS

## 2019-02-15 MED ORDER — LACTATED RINGERS IV SOLN
INTRAVENOUS | Status: DC | PRN
  Administered 2019-02-15 (×3): via INTRAVENOUS

## 2019-02-15 MED ORDER — NALOXONE HCL 4 MG/10ML IJ SOLN
1.0000 ug/kg/h | INTRAVENOUS | Status: DC | PRN
  Filled 2019-02-15: qty 5

## 2019-02-15 MED ORDER — FENTANYL CITRATE (PF) 100 MCG/2ML IJ SOLN
INTRAMUSCULAR | Status: DC | PRN
  Administered 2019-02-15: 15 ug via INTRATHECAL

## 2019-02-15 MED ORDER — KETOROLAC TROMETHAMINE 30 MG/ML IJ SOLN
30.0000 mg | Freq: Four times a day (QID) | INTRAMUSCULAR | Status: AC
  Administered 2019-02-15 – 2019-02-16 (×2): 30 mg via INTRAVENOUS
  Filled 2019-02-15 (×2): qty 1

## 2019-02-15 MED ORDER — OXYCODONE HCL 5 MG PO TABS: 5.0000 mg | ORAL_TABLET | Freq: Once | ORAL | Status: DC | PRN

## 2019-02-15 MED ORDER — DIPHENHYDRAMINE HCL 25 MG PO CAPS
25.0000 mg | ORAL_CAPSULE | ORAL | Status: DC | PRN
  Administered 2019-02-16: 25 mg via ORAL

## 2019-02-15 MED ORDER — PHENYLEPHRINE HCL (PRESSORS) 10 MG/ML IV SOLN
INTRAVENOUS | Status: DC | PRN
  Administered 2019-02-15 (×2): 80 ug via INTRAVENOUS
  Administered 2019-02-15: 120 ug via INTRAVENOUS

## 2019-02-15 MED ORDER — OXYTOCIN 10 UNIT/ML IJ SOLN
INTRAMUSCULAR | Status: DC | PRN
  Administered 2019-02-15: 40 [IU] via INTRAMUSCULAR

## 2019-02-15 MED ORDER — KETOROLAC TROMETHAMINE 30 MG/ML IJ SOLN
30.0000 mg | Freq: Once | INTRAMUSCULAR | Status: AC | PRN
  Administered 2019-02-15: 30 mg via INTRAVENOUS

## 2019-02-15 MED ORDER — ONDANSETRON HCL 4 MG/2ML IJ SOLN
INTRAMUSCULAR | Status: DC | PRN
  Administered 2019-02-15: 4 mg via INTRAVENOUS

## 2019-02-15 MED ORDER — KETOROLAC TROMETHAMINE 30 MG/ML IJ SOLN
INTRAMUSCULAR | Status: AC
  Filled 2019-02-15: qty 1

## 2019-02-15 MED ORDER — ENOXAPARIN SODIUM 80 MG/0.8ML ~~LOC~~ SOLN
0.5000 mg/kg | SUBCUTANEOUS | Status: DC
  Administered 2019-02-16 – 2019-02-18 (×3): 65 mg via SUBCUTANEOUS
  Filled 2019-02-15 (×3): qty 0.8

## 2019-02-15 MED ORDER — DIPHENHYDRAMINE HCL 50 MG/ML IJ SOLN: 12.5000 mg | INTRAMUSCULAR | Status: DC | PRN

## 2019-02-15 MED ORDER — PHENYLEPHRINE HCL-NACL 10-0.9 MG/250ML-% IV SOLN
INTRAVENOUS | Status: DC | PRN
  Administered 2019-02-15: 60 ug/min via INTRAVENOUS

## 2019-02-15 MED ORDER — MEPERIDINE HCL 25 MG/ML IJ SOLN
INTRAMUSCULAR | Status: DC | PRN
  Administered 2019-02-15 (×2): 12.5 mg via INTRAVENOUS

## 2019-02-15 MED ORDER — WITCH HAZEL-GLYCERIN EX PADS: 1.0000 "application " | MEDICATED_PAD | CUTANEOUS | Status: DC | PRN

## 2019-02-15 MED ORDER — ACETAMINOPHEN 500 MG PO TABS
1000.0000 mg | ORAL_TABLET | Freq: Once | ORAL | Status: AC
  Administered 2019-02-15: 1000 mg via ORAL
  Filled 2019-02-15: qty 2

## 2019-02-15 MED ORDER — DIBUCAINE (PERIANAL) 1 % EX OINT: 1.0000 "application " | TOPICAL_OINTMENT | CUTANEOUS | Status: DC | PRN

## 2019-02-15 MED ORDER — DEXAMETHASONE SODIUM PHOSPHATE 4 MG/ML IJ SOLN
INTRAMUSCULAR | Status: DC | PRN
  Administered 2019-02-15: 4 mg via INTRAVENOUS

## 2019-02-15 MED ORDER — MORPHINE SULFATE (PF) 0.5 MG/ML IJ SOLN
INTRAMUSCULAR | Status: AC
  Filled 2019-02-15: qty 10

## 2019-02-15 MED ORDER — SCOPOLAMINE 1 MG/3DAYS TD PT72
1.0000 | MEDICATED_PATCH | Freq: Once | TRANSDERMAL | Status: AC
  Administered 2019-02-15: 1.5 mg via TRANSDERMAL

## 2019-02-15 MED ORDER — MEPERIDINE HCL 25 MG/ML IJ SOLN: 6.2500 mg | INTRAMUSCULAR | Status: DC | PRN

## 2019-02-15 MED ORDER — SODIUM CHLORIDE 0.9 % IR SOLN
Status: DC | PRN
  Administered 2019-02-15: 1

## 2019-02-15 MED ORDER — PHENYLEPHRINE HCL-NACL 20-0.9 MG/250ML-% IV SOLN
INTRAVENOUS | Status: AC
  Filled 2019-02-15: qty 250

## 2019-02-15 MED ORDER — MORPHINE SULFATE (PF) 0.5 MG/ML IJ SOLN
INTRAMUSCULAR | Status: DC | PRN
  Administered 2019-02-15: 150 ug via INTRATHECAL

## 2019-02-15 MED ORDER — OXYTOCIN 40 UNITS IN NORMAL SALINE INFUSION - SIMPLE MED: 2.5000 [IU]/h | INTRAVENOUS | Status: AC

## 2019-02-15 MED ORDER — SIMETHICONE 80 MG PO CHEW
80.0000 mg | CHEWABLE_TABLET | ORAL | Status: DC
  Administered 2019-02-15 – 2019-02-17 (×3): 80 mg via ORAL
  Filled 2019-02-15 (×3): qty 1

## 2019-02-15 MED ORDER — SOD CITRATE-CITRIC ACID 500-334 MG/5ML PO SOLN
ORAL | Status: AC
  Filled 2019-02-15: qty 30

## 2019-02-15 MED ORDER — NALBUPHINE HCL 10 MG/ML IJ SOLN
5.0000 mg | Freq: Once | INTRAMUSCULAR | Status: DC | PRN
  Filled 2019-02-15: qty 0.5

## 2019-02-15 MED ORDER — ONDANSETRON HCL 4 MG/2ML IJ SOLN: 4.0000 mg | Freq: Three times a day (TID) | INTRAMUSCULAR | Status: DC | PRN

## 2019-02-15 MED ORDER — ONDANSETRON HCL 4 MG/2ML IJ SOLN
INTRAMUSCULAR | Status: AC
  Filled 2019-02-15: qty 2

## 2019-02-15 MED ORDER — SODIUM CHLORIDE 0.9% FLUSH: 3.0000 mL | INTRAVENOUS | Status: DC | PRN

## 2019-02-15 MED ORDER — PRENATAL MULTIVITAMIN CH
1.0000 | ORAL_TABLET | Freq: Every day | ORAL | Status: DC
  Administered 2019-02-16 – 2019-02-18 (×3): 1 via ORAL
  Filled 2019-02-15 (×3): qty 1

## 2019-02-15 MED ORDER — OXYCODONE HCL 5 MG/5ML PO SOLN: 5.0000 mg | Freq: Once | ORAL | Status: DC | PRN

## 2019-02-15 MED ORDER — LACTATED RINGERS IV SOLN
INTRAVENOUS | Status: DC
  Administered 2019-02-15: 08:00:00 via INTRAVENOUS

## 2019-02-15 MED ORDER — MENTHOL 3 MG MT LOZG: 1.0000 | LOZENGE | OROMUCOSAL | Status: DC | PRN

## 2019-02-15 MED ORDER — PHENYLEPHRINE 40 MCG/ML (10ML) SYRINGE FOR IV PUSH (FOR BLOOD PRESSURE SUPPORT)
PREFILLED_SYRINGE | INTRAVENOUS | Status: AC
  Filled 2019-02-15: qty 10

## 2019-02-15 MED ORDER — SIMETHICONE 80 MG PO CHEW: 80.0000 mg | CHEWABLE_TABLET | ORAL | Status: DC | PRN

## 2019-02-15 MED ORDER — DIPHENHYDRAMINE HCL 25 MG PO CAPS
25.0000 mg | ORAL_CAPSULE | Freq: Four times a day (QID) | ORAL | Status: DC | PRN
  Filled 2019-02-15: qty 1

## 2019-02-15 MED ORDER — DEXTROSE 5 % IV SOLN
3.0000 g | INTRAVENOUS | Status: AC
  Administered 2019-02-15: 3 g via INTRAVENOUS

## 2019-02-15 MED ORDER — SENNOSIDES-DOCUSATE SODIUM 8.6-50 MG PO TABS
2.0000 | ORAL_TABLET | ORAL | Status: DC
  Administered 2019-02-15 – 2019-02-17 (×3): 2 via ORAL
  Filled 2019-02-15 (×3): qty 2

## 2019-02-15 MED ORDER — IBUPROFEN 800 MG PO TABS
800.0000 mg | ORAL_TABLET | Freq: Three times a day (TID) | ORAL | Status: DC
  Administered 2019-02-16 – 2019-02-18 (×6): 800 mg via ORAL
  Filled 2019-02-15 (×6): qty 1

## 2019-02-15 MED ORDER — BUPIVACAINE IN DEXTROSE 0.75-8.25 % IT SOLN
INTRATHECAL | Status: DC | PRN
  Administered 2019-02-15: 1.6 mL via INTRATHECAL

## 2019-02-15 MED ORDER — DEXTROSE 5 % IV SOLN
INTRAVENOUS | Status: AC
  Filled 2019-02-15: qty 3000

## 2019-02-15 MED ORDER — METOCLOPRAMIDE HCL 5 MG/ML IJ SOLN
INTRAMUSCULAR | Status: DC | PRN
  Administered 2019-02-15: 10 mg via INTRAVENOUS

## 2019-02-15 SURGICAL SUPPLY — 50 items
APL SKNCLS STERI-STRIP NONHPOA (GAUZE/BANDAGES/DRESSINGS) ×1
BENZOIN TINCTURE PRP APPL 2/3 (GAUZE/BANDAGES/DRESSINGS) ×3 IMPLANT
CHLORAPREP W/TINT 26ML (MISCELLANEOUS) ×3 IMPLANT
CLAMP CORD UMBIL (MISCELLANEOUS) IMPLANT
CLOSURE STERI STRIP 1/2 X4 (GAUZE/BANDAGES/DRESSINGS) ×1 IMPLANT
CLOSURE WOUND 1/2 X4 (GAUZE/BANDAGES/DRESSINGS) ×1
CLOTH BEACON ORANGE TIMEOUT ST (SAFETY) ×3 IMPLANT
DRAPE C SECTION CLR SCREEN (DRAPES) IMPLANT
DRSG OPSITE POSTOP 4X10 (GAUZE/BANDAGES/DRESSINGS) ×3 IMPLANT
ELECT REM PT RETURN 9FT ADLT (ELECTROSURGICAL) ×3
ELECTRODE REM PT RTRN 9FT ADLT (ELECTROSURGICAL) ×1 IMPLANT
EXTENDER TRAXI PANNICULUS (MISCELLANEOUS) IMPLANT
EXTRACTOR VACUUM M CUP 4 TUBE (SUCTIONS) IMPLANT
EXTRACTOR VACUUM M CUP 4' TUBE (SUCTIONS)
GAUZE SPONGE 4X4 12PLY STRL LF (GAUZE/BANDAGES/DRESSINGS) ×4 IMPLANT
GLOVE BIO SURGEON STRL SZ7.5 (GLOVE) ×3 IMPLANT
GLOVE BIOGEL PI IND STRL 7.0 (GLOVE) ×1 IMPLANT
GLOVE BIOGEL PI INDICATOR 7.0 (GLOVE) ×2
GOWN STRL REUS W/TWL 2XL LVL3 (GOWN DISPOSABLE) ×3 IMPLANT
GOWN STRL REUS W/TWL LRG LVL3 (GOWN DISPOSABLE) ×6 IMPLANT
HOVERMATT SINGLE USE (MISCELLANEOUS) ×2 IMPLANT
KIT ABG SYR 3ML LUER SLIP (SYRINGE) IMPLANT
NDL HYPO 25X5/8 SAFETYGLIDE (NEEDLE) IMPLANT
NEEDLE HYPO 22GX1.5 SAFETY (NEEDLE) ×3 IMPLANT
NEEDLE HYPO 25X5/8 SAFETYGLIDE (NEEDLE) IMPLANT
NS IRRIG 1000ML POUR BTL (IV SOLUTION) ×3 IMPLANT
PACK C SECTION WH (CUSTOM PROCEDURE TRAY) ×3 IMPLANT
PAD ABD 7.5X8 STRL (GAUZE/BANDAGES/DRESSINGS) ×2 IMPLANT
PAD OB MATERNITY 4.3X12.25 (PERSONAL CARE ITEMS) ×3 IMPLANT
PENCIL SMOKE EVAC W/HOLSTER (ELECTROSURGICAL) ×3 IMPLANT
RETRACTOR TRAXI PANNICULUS (MISCELLANEOUS) IMPLANT
RTRCTR C-SECT PINK 25CM LRG (MISCELLANEOUS) ×3 IMPLANT
SPONGE LAP 18X18 RF (DISPOSABLE) ×4 IMPLANT
STRIP CLOSURE SKIN 1/2X4 (GAUZE/BANDAGES/DRESSINGS) ×2 IMPLANT
SUT CHROMIC 1 CTX 36 (SUTURE) ×6 IMPLANT
SUT VIC AB 1 CT1 36 (SUTURE) ×6 IMPLANT
SUT VIC AB 2-0 CT1 (SUTURE) ×3 IMPLANT
SUT VIC AB 2-0 CT1 27 (SUTURE) ×3
SUT VIC AB 2-0 CT1 TAPERPNT 27 (SUTURE) ×1 IMPLANT
SUT VIC AB 3-0 CT1 27 (SUTURE) ×6
SUT VIC AB 3-0 CT1 TAPERPNT 27 (SUTURE) ×2 IMPLANT
SUT VIC AB 3-0 SH 27 (SUTURE)
SUT VIC AB 3-0 SH 27X BRD (SUTURE) IMPLANT
SUT VIC AB 4-0 KS 27 (SUTURE) ×5 IMPLANT
SYR BULB IRRIGATION 50ML (SYRINGE) IMPLANT
TOWEL OR 17X24 6PK STRL BLUE (TOWEL DISPOSABLE) ×3 IMPLANT
TRAXI PANNICULUS EXTENDER (MISCELLANEOUS) ×2
TRAXI PANNICULUS RETRACTOR (MISCELLANEOUS) ×2
TRAY FOLEY W/BAG SLVR 14FR LF (SET/KITS/TRAYS/PACK) ×3 IMPLANT
WATER STERILE IRR 1000ML POUR (IV SOLUTION) ×3 IMPLANT

## 2019-02-15 NOTE — Op Note (Signed)
Cesarean Section Procedure Note  02/15/2019  1:20 PM  PATIENT:  Kiara Romero  33 y.o. female  PRE-OPERATIVE DIAGNOSIS:  REPEAT CESAEAN SECTION  POST-OPERATIVE DIAGNOSIS:  REPEAT CESAEAN SECTION  PROCEDURE:  Procedure(s): CESAREAN SECTION (N/A)  SURGEON:  Surgeon(s) and Role:    * Chancy Milroy, MD - Primary    * Clarnce Flock, MD - Assisting  ASSISTANTS: none   ANESTHESIA:   spinal  EBL:  Total I/O In: 3400 [I.V.:3400] Out: 1008 [Urine:200; Blood:808]  BLOOD ADMINISTERED:none  DRAINS: none   LOCAL MEDICATIONS USED:  NONE  SPECIMEN:  No Specimen  DISPOSITION OF SPECIMEN:  N/A   Procedure Details   The patient was seen in the Holding Room. The risks, benefits, complications, treatment options, and expected outcomes were discussed with the patient.  The patient concurred with the proposed plan, giving informed consent.  The site of surgery properly noted/marked. The patient was taken to Operating Room, identified as Kiara Romero and the procedure verified as C-Section Delivery. A Time Out was held and the above information confirmed.  After induction of anesthesia, the patient was draped and prepped in the usual sterile manner. A Pfannenstiel incision was made and carried down through the subcutaneous tissue to the fascia. Fascial incision was made and extended transversely. The fascia was separated from the underlying rectus tissue superiorly and inferiorly. The peritoneum was identified and entered. Peritoneal incision was extended longitudinally. A low transverse uterine incision was made. Delivered from cephalic presentation was a 3845 gram Female with Apgar scores of 9 at one minute and 9 at five minutes. After the umbilical cord was clamped and cut cord blood was obtained for evaluation. The placenta was removed intact and appeared normal. The uterine outline, tubes and ovaries appeared normal. The uterine incision was closed with running locked sutures of 1-0  Chromic followed by an imbricating layer of the same suture. Hemostasis was observed. Lavage was carried out until clear. The fascia was then reapproximated with running sutures of 0-Vicryl. Subcutaneous space closed with interrupted sutures of 2-0 Vicryl. The skin was reapproximated with 4-0 Vicryl.  Instrument, sponge, and needle counts were correct prior the abdominal closure and at the conclusion of the case.   Complications:  None; patient tolerated the procedure well.  COUNTS:  YES  PLAN OF CARE: Admit to inpatient   PATIENT DISPOSITION:  PACU - hemodynamically stable.   Delay start of Pharmacological VTE agent (>24hrs) due to surgical blood loss or risk of bleeding: no             Clarnce Flock, MD 02/15/2019 1:20 PM

## 2019-02-15 NOTE — Anesthesia Preprocedure Evaluation (Signed)
Anesthesia Evaluation  Patient identified by MRN, date of birth, ID band Patient awake    Reviewed: Allergy & Precautions, NPO status , Patient's Chart, lab work & pertinent test results  Airway Mallampati: II  TM Distance: >3 FB Neck ROM: Full    Dental no notable dental hx.    Pulmonary neg pulmonary ROS,    Pulmonary exam normal breath sounds clear to auscultation       Cardiovascular negative cardio ROS Normal cardiovascular exam Rhythm:Regular Rate:Normal     Neuro/Psych negative neurological ROS  negative psych ROS   GI/Hepatic negative GI ROS, Neg liver ROS,   Endo/Other  Morbid obesity  Renal/GU negative Renal ROS  negative genitourinary   Musculoskeletal negative musculoskeletal ROS (+)   Abdominal (+) + obese,   Peds negative pediatric ROS (+)  Hematology negative hematology ROS (+)   Anesthesia Other Findings   Reproductive/Obstetrics (+) Pregnancy                             Anesthesia Physical Anesthesia Plan  ASA: III  Anesthesia Plan: Spinal   Post-op Pain Management:    Induction:   PONV Risk Score and Plan: 2 and Treatment may vary due to age or medical condition  Airway Management Planned: Natural Airway  Additional Equipment:   Intra-op Plan:   Post-operative Plan:   Informed Consent: I have reviewed the patients History and Physical, chart, labs and discussed the procedure including the risks, benefits and alternatives for the proposed anesthesia with the patient or authorized representative who has indicated his/her understanding and acceptance.     Dental advisory given  Plan Discussed with: CRNA  Anesthesia Plan Comments:         Anesthesia Quick Evaluation

## 2019-02-15 NOTE — Lactation Note (Addendum)
This note was copied from a baby's chart. Lactation Consultation Note  Patient Name: Kiara Romero [id number] Date: 02/15/2019 Reason for consult: Difficult latch;Mother's request;Term P1, 9 hour female infant. Mom  maternal hx: AMA and C/S delivery. Per mom, infant only latched maybe 1 minute and has  not been  latching at breast. Tools given: Mom was given DEBP earlier by [facility] services, LC gave mom a hand pump to pre-pump breast prior to latching infant and breast shells  due to ,mom having  short shafted nipples.  LC reviewed hand expression and infant was given 1 ml of colostrum by spoon. Mom understands to wear breast shells in bra during the day and not at night. LC had mom to pre-pump prior to latching infant, mom latched infant on her left breast using the football hold position, infant open mouth wide, top lip flanged out and sustained latch, infant was still breastfeeding after 13 minutes when [place] left room. Mom knows to call Nurse or [place] if she has any further questions, concerns or need assistance with latching infant to breast. Mom knows to breastfeed infant according to hunger cues, 8 to 12 times within 24 hours and on demand.  Parents will continue to do as much STS as possible.  Maternal Data    Feeding Feeding Type: Breast Fed  LATCH Score Latch: Grasps breast easily, tongue down, lips flanged, rhythmical sucking.  Audible Swallowing: Spontaneous and intermittent  Type of Nipple: Everted at rest and after stimulation(Mom is short shafted.)  Comfort (Breast/Nipple): Soft / non-tender  Hold (Positioning): Assistance needed to correctly position infant at breast and maintain latch.  LATCH Score: 9  Interventions Interventions: Breast feeding basics reviewed;Breast compression;Adjust position;Assisted with latch;Skin to skin;Support pillows;Position options;Breast massage;DEBP;Expressed milk;Hand express;Pre-pump if needed;Shells  Lactation Tools Discussed/Used      Consult Status Consult Status: Follow-up Date: 02/16/19 Follow-up type: In-patient    Vicente Serene 02/15/2019, 9:08 PM

## 2019-02-15 NOTE — Anesthesia Postprocedure Evaluation (Signed)
Anesthesia Post Note  Patient: Kiara Romero  Procedure(s) Performed: CESAREAN SECTION (N/A )     Patient location during evaluation: PACU Anesthesia Type: Spinal Level of consciousness: oriented and awake and alert Pain management: pain level controlled Vital Signs Assessment: post-procedure vital signs reviewed and stable Respiratory status: spontaneous breathing and respiratory function stable Cardiovascular status: blood pressure returned to baseline and stable Postop Assessment: no headache, no backache and no apparent nausea or vomiting Anesthetic complications: no    Last Vitals:  Vitals:   02/15/19 1400 02/15/19 1413  BP: 136/66 (!) 142/72  Pulse: 83 79  Resp: 14   Temp: 37.2 C 37.1 C  SpO2: 99% 99%    Last Pain:  Vitals:   02/15/19 1413  TempSrc: Oral  PainSc:    Pain Goal: Patients Stated Pain Goal: 4 (02/15/19 0810)  LLE Motor Response: Purposeful movement (02/15/19 1345)   RLE Motor Response: Purposeful movement (02/15/19 1345)   L Sensory Level: S1-Sole of foot, small toes (02/15/19 1345) R Sensory Level: S1-Sole of foot, small toes (02/15/19 1345) Epidural/Spinal Function Cutaneous sensation: Able to Discern Pressure (02/15/19 1345), Patient able to flex knees: Yes (02/15/19 1345), Patient able to lift hips off bed: No (02/15/19 1345), Back pain beyond tenderness at insertion site: No (02/15/19 1345), Progressively worsening motor and/or sensory loss: No (02/15/19 1345), Bowel and/or bladder incontinence post epidural: No (02/15/19 1345)  Lynda Rainwater

## 2019-02-15 NOTE — Lactation Note (Signed)
This note was copied from a baby's chart. Lactation Consultation Note  Patient Name: Kiara Romero [id number] Date: 02/15/2019 Reason for consult: Initial assessment;Primapara;1st time breastfeeding;Term  1625 - 1705 - I conducted an initial consult with Kiara Romero, a P1. She indicated that she worked from home and would prefer to exclusively pump and bottle feed her daughter, Kiara Romero. However, I educated on differences between colostrum and mature milk, and she was receptive to breast feeding Nyla until her milk comes in.  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 I also set up a DEBP upon maternal request and showed her how to use her pump and clean her equipment.  We attempted to latch Nyla in cross cradle hold on her left breast. Her breasts are pendulous, and her nipple is everted. Baby was too sleepy to latch at this attempt, and I moved Nyla to STS on Kiara Romero's chest.  Prior to latching baby I showed her how to hand express and noted colostrum.  I educated on day 1 infant feeding patterns, output expectations for day 1 and recommended that she breast feed baby 8-12 times a day on demand and wake baby to feed as needed. I recommended that she pump for stimulation purposes, but to prioritize latching Nyla.  Ms. Cletus Gash will call lactation for assistance this evening as needed.  Maternal Data Formula Feeding for Exclusion: No Has patient been taught Hand Expression?: Yes Does the patient have breastfeeding experience prior to this delivery?: No   LATCH Score Latch: Too sleepy or reluctant, no latch achieved, no sucking elicited.  Audible Swallowing: None  Type of Nipple: Everted at rest and after stimulation  Comfort (Breast/Nipple): Soft / non-tender  Hold (Positioning): Assistance needed to correctly position infant at breast and maintain latch.  LATCH Score: 5  Interventions Interventions: Breast feeding basics reviewed;Assisted  with latch;Skin to skin;Hand express;Breast compression;Support pillows;DEBP;Hand pump  Lactation Tools Discussed/Used Pump Review: Setup, frequency, and cleaning Initiated by:: hl Date initiated:: 02/15/19   Consult Status Consult Status: Follow-up Date: 02/16/19 Follow-up type: In-patient    Lenore Manner 02/15/2019, 5:50 PM

## 2019-02-15 NOTE — Discharge Summary (Addendum)
Postpartum Discharge Summary     Patient Name: Kiara Romero DOB: Oct 23, 1985 MRN: [id number]  Date of admission: 02/15/2019 Delivering Provider: Chancy Milroy   Date of discharge: 02/18/2019  Admitting diagnosis: REPEAT CESAEAN SECTION Intrauterine pregnancy: [redacted]w[redacted]d    Secondary diagnosis:  Active Problems:   Supervision of other normal pregnancy, antepartum   History of C-section   Elevated blood pressure reading without diagnosis of hypertension   History of cesarean section   [redacted] weeks gestation of pregnancy   Anemia associated with acute blood loss  Additional problems: None     Discharge diagnosis: Term Pregnancy Delivered                                                                                                Post partum procedures:None  Augmentation: n/a  Complications: None  Hospital course:  Sceduled C/S   33y.o. yo G2P2002 at 35w0das admitted to the hospital 02/15/2019 for scheduled cesarean section with the following indication:Elective Repeat.  Membrane Rupture Time/Date: 11:56 AM ,02/15/2019   Patient delivered a Viable infant.02/15/2019  Details of operation can be found in separate operative note.  Pateint had an uncomplicated postpartum course.  She is ambulating, tolerating a regular diet, passing flatus, and urinating well. Loestrin prescribed on discharge per patient request; she understands to not take this until at least 6 weeks post-partum; also advised to ensure normal BP's and discuss with provider before starting. Oral iron prescribed on discharge. Patient is discharged home in stable condition on  02/18/19        Delivery time: 11:57 AM    Magnesium Sulfate received: No BMZ received: No Rhophylac:N/A MMR:N/A Transfusion:No  Physical exam  Vitals:   02/17/19 0532 02/17/19 1535 02/17/19 2201 02/18/19 0512  BP: 129/72 122/73 135/70 124/70  Pulse: 68 87 80 77  Resp: _0 Temp: 98.6 F (37 C) 98.5 F (36.9 C) 98.7 F (37.1 C)  97.6 F (36.4 C)  TempSrc: Oral Oral Oral   SpO2: 100% 99% 100% 98%  Weight:      Height:       General: alert, cooperative and no distress Lochia: appropriate Uterine Fundus: firm Incision: Healing well with no significant drainage, Dressing is clean, dry, and intact DVT Evaluation: No evidence of DVT seen on physical exam. Labs: Lab Results  Component Value Date   WBC 14.5 (H) 02/16/2019   HGB 9.9 (L) 02/16/2019   HCT 29.7 (L) 02/16/2019   MCV 89.7 02/16/2019   PLT 273 02/16/2019   CMP Latest Ref Rng & Units 02/16/2019  Glucose 70 - 99 mg/dL 102(H)  BUN 6 - 20 mg/dL 5(L)  Creatinine 0.44 - 1.00 mg/dL 0.80  Sodium 135 - 145 mmol/L 137  Potassium 3.5 - 5.1 mmol/L 3.5  Chloride 98 - 111 mmol/L 107  CO2 22 - 32 mmol/L 20(L)  Calcium 8.9 - 10.3 mg/dL 8.6(L)  Total Protein 6.5 - 8.1 g/dL 5.4(L)  Total Bilirubin 0.3 - 1.2 mg/dL 0.4  Alkaline Phos 38 - 126 U/L 74  AST 15 - 41 U/L 26  ALT  0 - 44 U/L 15    Discharge instruction: per After Visit Summary and "Baby and Me Booklet".  After visit meds:  Allergies as of 02/18/2019   No Known Allergies     Medication List    STOP taking these medications   Aspirin Low Dose 81 MG EC tablet Generic drug: aspirin   cetirizine 10 MG tablet Commonly known as: ZYRTEC   fluticasone 50 MCG/ACT nasal spray Commonly known as: FLONASE   lidocaine 2 % jelly Commonly known as: XYLOCAINE   omeprazole 20 MG capsule Commonly known as: PRILOSEC     TAKE these medications   calcium carbonate 750 MG chewable tablet Commonly known as: TUMS EX Chew 750 mg by mouth daily.   ferrous sulfate 325 (65 FE) MG tablet Take 1 tablet (325 mg total) by mouth daily with breakfast.   HYDROcodone-acetaminophen 5-325 MG tablet Commonly known as: NORCO/VICODIN Take 1-2 tablets by mouth every 4 (four) hours as needed for moderate pain.   ibuprofen 800 MG tablet Commonly known as: ADVIL Take 1 tablet (800 mg total) by mouth every 8 (eight) hours  as needed.   Lo Loestrin Fe 1 MG-10 MCG / 10 MCG tablet Generic drug: Norethindrone-Ethinyl Estradiol-Fe Biphas Take 1 tablet by mouth daily. Start taking on: April 18, 2019   polyethylene glycol 17 g packet Commonly known as: MIRALAX / GLYCOLAX Take 17 g by mouth daily.   Prenatal Gummies/DHA & FA 0.4-32.5 MG Chew Chew 1 tablet by mouth daily. Nature made   senna-docusate 8.6-50 MG tablet Commonly known as: Senokot-S Take 2 tablets by mouth daily. Start taking on: February 19, 2019       Diet: routine diet  Activity: Advance as tolerated. Pelvic rest for 6 weeks.   Outpatient follow up:6 weeks Follow up Appt: Future Appointments  Date Time Provider [place]  03/01/2019  9:45 AM Seabron Spates, CNM [facility] None  03/28/2019  9:00 AM Lavonia Drafts, MD [facility] None   Follow up Visit:   Please schedule this patient for Postpartum visit in: 6 weeks with the following provider: Any provider For C/S patients schedule nurse incision check in weeks 2 weeks: yes Low risk pregnancy complicated by: n/a Delivery mode:  CS Anticipated Birth Control:  OCPs PP Procedures needed: Incision check  Schedule Integrated BH visit: no   Newborn Data: Live born female  Birth Weight:  3485g APGAR: 39, 9  Newborn Delivery   Birth date/time: 02/15/2019 11:57:00 Delivery type: C-Section, Low Transverse Trial of labor: No C-section categorization: Repeat      Baby Feeding: Breast Disposition:home with mother   02/18/2019 Chauncey Mann, MD

## 2019-02-15 NOTE — H&P (Signed)
LABOR AND DELIVERY ADMISSION HISTORY AND PHYSICAL NOTE  Kiara SkinnerRenee M Romero is a 33 y.o. female G2P1001 with IUP at 4215w0d by LMP c/w 18wk US presenting for elective repeat cesarean.   She reports positive fetal movement. She denies leakage of fluid or vaginal bleeding.   Denies headaches, vision changes, chest pain, SOB, LE edema.  She plans on breast feeding. She requests OCPs for birth control.  Prenatal History/Complications: PNC at [facility]  [facility]:  @[redacted]w[redacted]d , CWD, normal anatomy, cephalic presentation, posterior placenta, 89%ile, EFW 3351 grams  Pregnancy complications:  - multiple elevated BP's in August/September 2020, normotensive since though initial BP on arrival was mild range, does not carry diagnosis currently  Past Medical History: Past Medical History:  Diagnosis Date  . Anxiety     Past Surgical History: Past Surgical History:  Procedure Laterality Date  . CESAREAN SECTION  2005    Obstetrical History: OB History    Gravida  2   Para  1   Term  1   Preterm      AB      Living  1     SAB      TAB      Ectopic      Multiple      Live Births  1           Social History: Social History   Socioeconomic History  . Marital status: Single    Spouse name: Not on file  . Number of children: Not on file  . Years of education: Not on file  . Highest education level: Not on file  Occupational History  . Not on file  Social Needs  . Financial resource strain: Not on file  . Food insecurity    Worry: Not on file    Inability: Not on file  . Transportation needs    Medical: Not on file    Non-medical: Not on file  Tobacco Use  . Smoking status: Never Smoker  . Smokeless tobacco: Never Used  Substance and Sexual Activity  . Alcohol use: No  . Drug use: No  . Sexual activity: Not on file  Lifestyle  . Physical activity    Days per week: Not on file    Minutes per session: Not on file  . Stress: Not on file  Relationships  . Social Wellsite geologistconnections     Talks on phone: Not on file    Gets together: Not on file    Attends religious service: Not on file    Active member of club or organization: Not on file    Attends meetings of clubs or organizations: Not on file    Relationship status: Not on file  Other Topics Concern  . Not on file  Social History Narrative  . Not on file    Family History: Family History  Problem Relation Age of Onset  . Diabetes Mother   . Hyperlipidemia Mother   . Hypertension Mother   . Depression Mother   . Hyperlipidemia Father   . Heart disease Father   . Drug abuse Father   . Depression Brother   . Hypertension Brother   . Anxiety disorder Brother     Allergies: No Known Allergies  Facility-Administered Medications Prior to Admission  Medication Dose Route Frequency Provider Last Rate Last Dose  . prenatal multivitamin tablet 1 tablet  1 tablet Oral Q1200 Conan Bowensavis, Kelly M, MD       Medications Prior to Admission  Medication Sig  Dispense Refill Last Dose  . ASPIRIN LOW DOSE 81 MG EC tablet TAKE 1 TABLET BY MOUTH DAILY TAKE AFTER 12 WEEKS FOR PREVENTION OF PREECLAMPSIA LATER IN PREGNANCY (Patient taking differently: Take 81 mg by mouth daily. ) 30 tablet 3 02/14/2019 at Unknown time  . calcium carbonate (TUMS EX) 750 MG chewable tablet Chew 750 mg by mouth daily.    Past Week at Unknown time  . Prenatal MV-Min-FA-Omega-3 (PRENATAL GUMMIES/DHA & FA) 0.4-32.5 MG CHEW Chew 1 tablet by mouth daily. Nature made   02/14/2019 at Unknown time  . cetirizine (ZYRTEC) 10 MG tablet Take 1 tablet (10 mg total) by mouth at bedtime. (Patient not taking: Reported on 02/04/2019) 14 tablet 0   . fluticasone (FLONASE) 50 MCG/ACT nasal spray Place 2 sprays into both nostrils daily. (Patient not taking: Reported on 01/19/2019) 16 g 6   . lidocaine (XYLOCAINE) 2 % jelly Place 1 application into the urethra as needed. (Patient not taking: Reported on 02/04/2019) 5 mL 0   . omeprazole (PRILOSEC) 20 MG capsule Take 1  capsule (20 mg total) by mouth daily. (Patient not taking: Reported on 01/31/2019) 14 capsule 0      Review of Systems  All systems reviewed and negative except as stated in HPI  Physical Exam Blood pressure 129/72, pulse 85, temperature 98.5 F (36.9 C), temperature source Oral, resp. rate 18, height 5\' 3"  (1.6 m), weight 128.8 kg, last menstrual period 05/18/2018, SpO2 99 %. General appearance: alert, oriented, NAD Lungs: normal respiratory effort Heart: regular rate Abdomen: soft, non-tender; gravid, leopolds 3600g Extremities: No calf swelling or tenderness  Prenatal labs: ABO, Rh: --/--/O POS, O POS Performed at [facility], [address], [place], [place]  [phone]) Antibody: NEG (11/29 0841) Rubella: 1.73 (05/27 1538) RPR: NON REACTIVE (11/29 0841)  HBsAg: Negative (05/27 1538)  HIV: Non Reactive (08/21 0925)  GC/Chlamydia: neg/neg 01/31/2019  GBS: Negative/-- (11/16 1421)  2-hr GTT: normal 11/05/2018 Genetic screening:  Low risk panorama Anatomy 11/07/2018: normal  Prenatal Transfer Tool  Maternal Diabetes: No Genetic Screening: Normal Maternal Ultrasounds/Referrals: Normal Fetal Ultrasounds or other Referrals:  None Maternal Substance Abuse:  No Significant Maternal Medications:  None Significant Maternal Lab Results: Group B Strep negative  No results found for this or any previous visit (from the past 24 hour(s)).  Patient Active Problem List   Diagnosis Date Noted  . Urethral bleeding 01/31/2019  . Pain in urethral meatus 01/31/2019  . Supervision of other normal pregnancy, antepartum 08/11/2018  . History of C-section 08/11/2018  . Elevated blood pressure reading without diagnosis of hypertension 08/11/2018  . Obesity affecting pregnancy 08/11/2018  . Acute pain of right knee 05/15/2017    Assessment: Kiara Romero is a 33 y.o. G2P1001 at [redacted]w[redacted]d here for scheduled CS.  #Elective RCS:  The risks of cesarean section discussed with the  patient included but were not limited to: bleeding which may require transfusion or reoperation; infection which may require antibiotics; injury to bowel, bladder, ureters or other surrounding organs; injury to the fetus; need for additional procedures including hysterectomy in the event of a life-threatening hemorrhage; placental abnormalities with subsequent pregnancies, incisional problems, thromboembolic phenomenon and other postoperative/anesthesia complications. The patient concurred with the proposed plan, giving informed written consent for the procedure. Patient has been NPO since last night she will remain NPO for procedure. Anesthesia and OR aware. Preoperative prophylactic antibiotics and SCDs ordered on call to the OR. To OR when ready.   #  Anesthesia: Spinal #GBS/ID: negative #COVID: swab negative on 02/13/2019 #MOF: Breast #MOC: OCPs #Circ: n/a  #Elevated BP's: multiple elevated BP's earlier in pregnancy and again today. CBC unremarkable, CMP pending, will order UPCR, monitor BP's closely.  Annice Needy [facility] 02/15/2019, 9:03 AM

## 2019-02-15 NOTE — Transfer of Care (Signed)
Immediate Anesthesia Transfer of Care Note  Patient: Kiara Romero  Procedure(s) Performed: CESAREAN SECTION (N/A )  Patient Location: PACU  Anesthesia Type:Spinal  Level of Consciousness: awake, alert  and oriented  Airway & Oxygen Therapy: Patient Spontanous Breathing  Post-op Assessment: Report given to RN and Post -op Vital signs reviewed and stable  Post vital signs: Reviewed and stable  Last Vitals:  Vitals Value Taken Time  BP 134/75 02/15/19 1300  Temp    Pulse 87 02/15/19 1302  Resp 29 02/15/19 1302  SpO2 100 % 02/15/19 1302  Vitals shown include unvalidated device data.  Last Pain:  Vitals:   02/15/19 0810  TempSrc: Oral  PainSc: 0-No pain      Patients Stated Pain Goal: 4 (40/81/44 8185)  Complications: No apparent anesthesia complications

## 2019-02-15 NOTE — Anesthesia Procedure Notes (Signed)
Spinal  Patient location during procedure: OB Start time: 02/15/2019 11:09 AM End time: 02/15/2019 11:14 AM Staffing Anesthesiologist: Lynda Rainwater, MD Performed: anesthesiologist  Preanesthetic Checklist Completed: patient identified, surgical consent, pre-op evaluation, timeout performed, IV checked, risks and benefits discussed and monitors and equipment checked Spinal Block Patient position: sitting Prep: site prepped and draped and DuraPrep Patient monitoring: heart rate, cardiac monitor, continuous pulse ox and blood pressure Approach: midline Location: L3-4 Injection technique: single-shot Needle Needle type: Pencan  Needle gauge: 24 G Needle length: 10 cm Assessment Sensory level: T4

## 2019-02-16 ENCOUNTER — Encounter (HOSPITAL_COMMUNITY): Payer: Self-pay | Admitting: Obstetrics and Gynecology

## 2019-02-16 LAB — COMPREHENSIVE METABOLIC PANEL
ALT: 15 U/L (ref 0–44)
AST: 26 U/L (ref 15–41)
Albumin: 2.1 g/dL — ABNORMAL LOW (ref 3.5–5.0)
Alkaline Phosphatase: 74 U/L (ref 38–126)
Anion gap: 10 (ref 5–15)
BUN: 5 mg/dL — ABNORMAL LOW (ref 6–20)
CO2: 20 mmol/L — ABNORMAL LOW (ref 22–32)
Calcium: 8.6 mg/dL — ABNORMAL LOW (ref 8.9–10.3)
Chloride: 107 mmol/L (ref 98–111)
Creatinine, Ser: 0.8 mg/dL (ref 0.44–1.00)
GFR calc Af Amer: >60 mL/min (ref >60)
GFR calc non Af Amer: >60 mL/min (ref >60)
Glucose, Bld: 102 mg/dL — ABNORMAL HIGH (ref 70–99)
Potassium: 3.5 mmol/L (ref 3.5–5.1)
Sodium: 137 mmol/L (ref 135–145)
Total Bilirubin: 0.4 mg/dL (ref 0.3–1.2)
Total Protein: 5.4 g/dL — ABNORMAL LOW (ref 6.5–8.1)

## 2019-02-16 LAB — CBC
HCT: 29.7 % — ABNORMAL LOW (ref 36.0–46.0)
Hemoglobin: 9.9 g/dL — ABNORMAL LOW (ref 12.0–15.0)
MCH: 29.9 pg (ref 26.0–34.0)
MCHC: 33.3 g/dL (ref 30.0–36.0)
MCV: 89.7 fL (ref 80.0–100.0)
Platelets: 273 10*3/uL (ref 150–400)
RBC: 3.31 MIL/uL — ABNORMAL LOW (ref 3.87–5.11)
RDW: 14.3 % (ref 11.5–15.5)
WBC: 14.5 10*3/uL — ABNORMAL HIGH (ref 4.0–10.5)
nRBC: 0 % (ref 0.0–0.2)

## 2019-02-16 LAB — PROTEIN / CREATININE RATIO, URINE
Creatinine, Urine: 42.37 mg/dL
Total Protein, Urine: <6 mg/dL

## 2019-02-16 LAB — BIRTH TISSUE RECOVERY COLLECTION (PLACENTA DONATION)

## 2019-02-16 MED ORDER — FERROUS SULFATE 325 (65 FE) MG PO TABS
325.0000 mg | ORAL_TABLET | Freq: Every day | ORAL | Status: DC
  Administered 2019-02-16 – 2019-02-18 (×3): 325 mg via ORAL
  Filled 2019-02-16 (×3): qty 1

## 2019-02-16 NOTE — Progress Notes (Signed)
POSTPARTUM PROGRESS NOTE  POD #1  Subjective:  Kiara Romero is a 33 y.o. [id number] s/p RLTCS at [redacted]w[redacted]d. She is doing well. No acute events overnight. She denies any problems with ambulating, voiding or po intake. Denies nausea or vomiting. She has not passed flatus. Pain is moderately controlled on Tylenol with no additional need for narcotics. Lochia is appropriate.  Objective: Blood pressure 123/63, pulse 63, temperature 98.2 F (36.8 C), temperature source Oral, resp. rate 16, height 5\' 3"  (1.6 m), weight 128.8 kg, last menstrual period 05/18/2018, SpO2 98 %, unknown if currently breastfeeding.  Physical Exam:  General: alert, cooperative and no distress Chest: no respiratory distress Heart:regular rate, distal pulses intact Abdomen: soft, nontender,  Uterine Fundus: firm, appropriately tender DVT Evaluation: No calf swelling or tenderness Extremities: no edema Skin: warm, dry; incision clean/dry/intact w/ honeycomb dressing in place  Recent Labs    02/13/19 0841 02/16/19 0517  HGB 12.8 9.9*  HCT 38.3 29.7*    Assessment/Plan: Kiara Romero is a 33 y.o. [id number] s/p RLTCS at [redacted]w[redacted]d for term pregnancy.  POD#1 - Doing welll; pain moderately controlled. Hb 9.9 following surgery  Routine postpartum care  OOB, ambulated  Lovenox for VTE prophylaxis Normocytic Anemia: asymptomatic  Continue to trend H/H with serial CBC  Start po ferrous sulfate BID  Contraception: OCPs Feeding: currently pumping, plans to attempt breast feeding later today  Dispo: Plan for discharge tomorrow.   LOS: 1 day   Phill Myron, D.O. OB Fellow  02/16/2019, 7:57 AM  POSTPARTUM PROGRESS NOTE  POD #1  Subjective:  Kiara Romero is a 33 y.o. [id number] s/p RLTCS at [redacted]w[redacted]d.  She reports she is doing well. No acute events overnight. She denies any problems with ambulating, voiding or po intake. Denies nausea or vomiting. She has not passed flatus. Pain is moderately controlled on Tylenol.   Lochia is appropriate.  Objective: Blood pressure 123/63, pulse 63, temperature 98.2 F (36.8 C), temperature source Oral, resp. rate 16, height 5\' 3"  (1.6 m), weight 128.8 kg, last menstrual period 05/18/2018, SpO2 98 %, unknown if currently breastfeeding.  Physical Exam:  General: alert, cooperative and no distress Chest: no respiratory distress Heart:regular rate, distal pulses intact Abdomen: soft, nontender,  Uterine Fundus: firm, appropriately tender DVT Evaluation: No calf swelling or tenderness Extremities: no LE edema Skin: warm, dry; incision clean/dry/intact w/ honeycomb dressing in place  Recent Labs    02/13/19 0841 02/16/19 0517  HGB 12.8 9.9*  HCT 38.3 29.7*    Assessment/Plan: Kiara Romero is a 33 y.o. [id number] s/p RLTCS at [redacted]w[redacted]d for term pregnancy.  POD#1 - Doing welll; pain moderately controlled. H/H 9.9/.29.7 following surgery, declined from 12.8/38.3  Routine postpartum care  OOB, ambulated  Lovenox for VTE prophylaxis  Normocytic Anemia: asymptomatic  Start po ferrous sulfate BID  Continue to monitor H/H  Contraception: OCPs Feeding: failed attempt at breast feeding yesterday d/t poor latch, currently pumping, will re-attempt breast feeding later today  Dispo: Plan for discharge tomorrow 12/3.   LOS: 1 day   Phill Myron, D.O. OB Fellow  02/16/2019, 7:57 AM

## 2019-02-16 NOTE — Progress Notes (Signed)
POSTPARTUM PROGRESS NOTE  POD #1  Subjective:  Kiara Romero is a 33 y.o. [id number] s/p rLTCS at [redacted]w[redacted]d.  She reports she doing well. No acute events overnight. She denies any problems with ambulating, voiding or po intake. Denies nausea or vomiting. She has passed flatus. Pain is well controlled.  Lochia is like a period.  Denies headache, vision changes, chest pain, SOB, LE edema.   Objective: Blood pressure 123/63, pulse 63, temperature 98.2 F (36.8 C), temperature source Oral, resp. rate 16, height 5\' 3"  (1.6 m), weight 128.8 kg, last menstrual period 05/18/2018, SpO2 98 %, unknown if currently breastfeeding.  Physical Exam:  General: alert, cooperative and no distress Chest: no respiratory distress Heart:regular rate, distal pulses intact Abdomen: soft, nontender,  Uterine Fundus: firm, appropriately tender DVT Evaluation: No calf swelling or tenderness Extremities: no LE edema Skin: warm, dry; incision clean/dry/intact w/ pressure dressing in place  Recent Labs    02/16/19 0517  HGB 9.9*  HCT 29.7*    Assessment/Plan: Kiara Romero is a 33 y.o. [id number] s/p rLTCS at [redacted]w[redacted]d for elective repeat.  POD#1 - Doing welll; pain well controlled. H/H appropriate  Routine postpartum care  OOB, ambulated  Lovenox for VTE prophylaxis Anemia: asymptomatic  Start po ferrous sulfate PIH: ongoing elevated BP's, will send off CMP and UPCR today, asymptomatic Contraception: OCP's at 6 weeks Feeding: Breast  Dispo: Plan for discharge POD#2-3.   LOS: 1 day   Kiara Coupe, MD/[facility] OB Fellow  02/16/2019, 10:24 AM

## 2019-02-16 NOTE — Lactation Note (Signed)
This note was copied from a baby's chart. Lactation Consultation Note  Patient Name: Kiara Romero [id number] Date: 02/16/2019 Reason for consult: Difficult latch;1st time breastfeeding;Term;Infant weight loss Baby is 90 1/2 hours old Baby awake and hungry. LC offered to assist and mom receptive.  LC noted areola edema and semi compressible.  LC had mom massage breast / hand express/ pre - pump with hand pump and reverse pressure / and she did it well.  LC assisted to latch the baby on the right breast / football / with depth / swallows and baby fed for 19 mins . Satisfied after feeding.  Per mom feeding was comfortable.  LC recommended shells between feedings except when sleeping .  Prior to latch - apply moist heat,  breast massage , hand express, pre-pump With hand pump and reverse pressure.  Latch with firm support and assistance for depth.  After feeding post pump both breast for 15 -20 mins.  Mom already has shells , hand pump and DEBP set up.    Maternal Data Has patient been taught Hand Expression?: Yes  Feeding Feeding Type: Breast Fed  LATCH Score Latch: Grasps breast easily, tongue down, lips flanged, rhythmical sucking.  Audible Swallowing: A few with stimulation(increased to 2)  Type of Nipple: Everted at rest and after stimulation  Comfort (Breast/Nipple): Soft / non-tender  Hold (Positioning): Assistance needed to correctly position infant at breast and maintain latch.  LATCH Score: 8  Interventions Interventions: Breast feeding basics reviewed;Assisted with latch;Skin to skin;Breast massage;Hand express;Pre-pump if needed;Reverse pressure;Breast compression;Adjust position;Support pillows;Position options;Shells;Hand pump;DEBP  Lactation Tools Discussed/Used Tools: Shells;Pump;Flanges Flange Size: 24 Shell Type: Inverted Breast pump type: Double-Electric Breast Pump;Manual   Consult Status Consult Status: Follow-up Date: 02/17/19 Follow-up type:  In-patient    [place] 02/16/2019, 12:29 PM

## 2019-02-16 NOTE — Progress Notes (Addendum)
POSTPARTUM PROGRESS NOTE  Subjective: Kiara Romero is a 33 y.o. [id number] s/p rLTCS at [redacted]w[redacted]d.  She reports she doing well. No acute events overnight. She denies any problems with ambulating, voiding or po intake. Denies nausea or vomiting. She has passed flatus. Pain is moderately controlled.  Lochia is appropriate.  Objective: Blood pressure 130/69, pulse 79, temperature 98.3 F (36.8 C), resp. rate 18, height 5\' 3"  (1.6 m), weight 128.8 kg, last menstrual period 05/18/2018, SpO2 99 %, unknown if currently breastfeeding.  Physical Exam:  General: alert, cooperative and no distress Chest: no respiratory distress Abdomen: soft, pain at surgical incision, dressing dry and intact Uterine Fundus: firm, appropriately tender Extremities: No calf swelling or tenderness  minimal edema  Recent Labs    02/16/19 0517  HGB 9.9*  HCT 29.7*    Assessment/Plan: Kiara Romero is a 33 y.o. [id number] s/p rLTCS at [redacted]w[redacted]d for elective C-Section .  Routine Postpartum Care: Doing well, pain well-controlled.  -- Continue routine care, lactation support  -- Contraception: OCP -- Feeding: Breastfeeding (donor)  Dispo: Plan for discharge tomorrow.  Carollee Leitz MD [place] [facility]   CNM attestation Post Partum Day #2 I have seen and examined this patient and agree with above documentation in the med student's note.   Kiara Romero is a 33 y.o. G2P2002 s/p rLTCS.  Pt denies problems with ambulating, voiding or po intake. Pain is well controlled.  Plan for birth control is oral progesterone-only contraceptive.  Method of Feeding: breast  Had some intermittent elevated BPs 140-150s SBP on 12/1. Has been normotensive since then with neg pre-e labs (P/C too low to calculate).  PE:  BP 129/72 (BP Location: Right Arm)   Pulse 68   Temp 98.6 F (37 C) (Oral)   Resp 18   Ht 5\' 3"  (1.6 m)   Wt 128.8 kg   LMP 05/18/2018 (Exact Date)   SpO2 100%   Breastfeeding Unknown    BMI 50.31 kg/m  Fundus firm  Plan for discharge: 02/18/19 per pt request  Myrtis Ser, CNM 9:31 AM  02/17/2019

## 2019-02-17 ENCOUNTER — Other Ambulatory Visit: Payer: Self-pay

## 2019-02-18 DIAGNOSIS — D62 Acute posthemorrhagic anemia: Secondary | ICD-10-CM

## 2019-02-18 DIAGNOSIS — Z3A39 39 weeks gestation of pregnancy: Secondary | ICD-10-CM

## 2019-02-18 MED ORDER — HYDROCODONE-ACETAMINOPHEN 5-325 MG PO TABS: 1.0000 | ORAL_TABLET | ORAL | 0 refills | Status: DC | PRN

## 2019-02-18 MED ORDER — POLYETHYLENE GLYCOL 3350 17 G PO PACK: 17.0000 g | PACK | Freq: Every day | ORAL | 0 refills | Status: DC

## 2019-02-18 MED ORDER — SENNOSIDES-DOCUSATE SODIUM 8.6-50 MG PO TABS: 2.0000 | ORAL_TABLET | ORAL | 0 refills | Status: DC

## 2019-02-18 MED ORDER — FERROUS SULFATE 325 (65 FE) MG PO TABS: 325.0000 mg | ORAL_TABLET | Freq: Every day | ORAL | 0 refills | Status: DC

## 2019-02-18 MED ORDER — POLYETHYLENE GLYCOL 3350 17 G PO PACK
17.0000 g | PACK | Freq: Every day | ORAL | Status: DC
  Administered 2019-02-18: 17 g via ORAL
  Filled 2019-02-18: qty 1

## 2019-02-18 MED ORDER — IBUPROFEN 800 MG PO TABS: 800.0000 mg | ORAL_TABLET | Freq: Three times a day (TID) | ORAL | 0 refills | Status: DC | PRN

## 2019-02-18 MED ORDER — LO LOESTRIN FE 1 MG-10 MCG / 10 MCG PO TABS: 1.0000 | ORAL_TABLET | Freq: Every day | ORAL | 3 refills | Status: DC

## 2019-02-18 NOTE — Lactation Note (Signed)
This note was copied from a baby's chart. Lactation Consultation Note  Patient Name: Kiara Romero TELMR'A Date: 02/18/2019 Reason for consult: Follow-up assessment;Primapara;1st time breastfeeding;Term;Infant weight loss;Other (Comment);Nipple pain/trauma(6 % weight loss)  Baby is 64 hours old  Baby asleep,per mom the baby last fed at 7:30 am - 25 ml.  Per mom my nipples are sensitive and sore, breast are heavier today.  LC offered to assess breast tissue and noted the nipples to be clear, just  Some dryness. Mom has coconut oil.  LC reviewed supply and demand , importance of consistent pumping 8 -10 times a day around the clock both breast for 15 -20 moms.  LC also recommended to allow the breast to hang more natural when pumping and the breast tissue will mold better in the flange. Flange size may vary depending the fullness of the breast and comfort.  LC recommended using a dab of coconut oil on the nipple and areola and then pump. The coconut oil should be off and then the comfort gels x 6 days, alternating with shells except when sleeping.  Sore nipple and engorgement prevention and tx reviewed.  Mom has a hand pump, the DEBP kit and her DEBP Medela for home.  Storage of breast milk reviewed and the [facility] phone numbers if needed.    Maternal Data Has patient been taught Hand Expression?: Yes  Feeding Feeding Type: (per mom baby recently had a bottle 25 ml  @ 7:30 AM)  LATCH Score                   Interventions Interventions: Breast feeding basics reviewed;Shells;Coconut oil;Comfort gels;Hand pump;DEBP  Lactation Tools Discussed/Used Tools: Shells;Pump;Comfort gels;Coconut oil;Flanges Flange Size: 24;27 Shell Type: Inverted Breast pump type: Double-Electric Breast Pump;Manual WIC Program: No Pump Review: Milk Storage   Consult Status Consult Status: Complete Date: 02/18/19    Myer Haff 02/18/2019, 8:42 AM

## 2019-02-25 ENCOUNTER — Encounter: Payer: Self-pay | Admitting: Obstetrics & Gynecology

## 2019-02-25 ENCOUNTER — Other Ambulatory Visit: Payer: Self-pay

## 2019-02-25 ENCOUNTER — Ambulatory Visit (INDEPENDENT_AMBULATORY_CARE_PROVIDER_SITE_OTHER): Payer: No Typology Code available for payment source | Admitting: Obstetrics & Gynecology

## 2019-02-25 VITALS — BP 138/81 | HR 87 | Temp 97.3°F

## 2019-02-25 DIAGNOSIS — Z4889 Encounter for other specified surgical aftercare: Secondary | ICD-10-CM

## 2019-02-25 DIAGNOSIS — Z9889 Other specified postprocedural states: Secondary | ICD-10-CM

## 2019-02-25 DIAGNOSIS — O99345 Other mental disorders complicating the puerperium: Secondary | ICD-10-CM

## 2019-02-25 DIAGNOSIS — F53 Postpartum depression: Secondary | ICD-10-CM

## 2019-02-25 NOTE — Progress Notes (Signed)
Postpartum 10 days post c-section. Patient states she has noticed some blood discharge from incision. Patient also states she has had multiple crying spells.patient has hx of anxiety and has been treated with medications in the past. Patient scored 12 on [place].   Kathrene Alu RN

## 2019-02-25 NOTE — Progress Notes (Signed)
History:  33 y.o. L3Y1017 here today for eval of incision. She reports some drainage and an odor. Pt denies fever or chills. She also reports frequents crying spells. She denies feeling depression and doesn't feel 'sad'.  Pt denies homicidal or suicidal ideation.   Edinburgh scale 12  The following portions of the patient's history were reviewed and updated as appropriate: allergies, current medications, past family history, past medical history, past social history, past surgical history and problem list.  Review of Systems:  Pertinent items are noted in HPI.    Objective:  Physical Exam Blood pressure 138/81, pulse 87, temperature (!) 97.3 F (36.3 C), temperature source Oral, unknown if currently breastfeeding. Gen: NAD Abd: Soft, nontender and nondistended; large panus overlying incision. The incision is intact. No erythema or fluctuance. The incision is wet but, this appears to be sweat.  Pelvic: not done  Assessment & Plan:  Post op incision check  Reviewed care of incision  Rec keep incision clean and dry.   Showed pt how to keep pad on incision   Topical atbx ointment to incision for 1 week   Post partum blues- elevated Edinburgh test  Referral to [facility]   F/u in 2 weeks or sooner prn  Vaishnav Demartin L. Harraway-Smith, M.D., [place]nn June

## 2019-02-25 NOTE — Patient Instructions (Addendum)
Cesarean Delivery, Care After This sheet gives you information about how to care for yourself after your procedure. Your health care provider may also give you more specific instructions. If you have problems or questions, contact your health care provider. What can I expect after the procedure? After the procedure, it is common to have:  A small amount of blood or clear fluid coming from the incision.  Some redness, swelling, and pain in your incision area.  Some abdominal pain and soreness.  Vaginal bleeding (lochia). Even though you did not have a vaginal delivery, you will still have vaginal bleeding and discharge.  Pelvic cramps.  Fatigue. You may have pain, swelling, and discomfort in the tissue between your vagina and your anus (perineum) if:  Your C-section was unplanned, and you were allowed to labor and push.  An incision was made in the area (episiotomy) or the tissue tore during attempted vaginal delivery. Follow these instructions at home: Incision care   Follow instructions from your health care provider about how to take care of your incision. Make sure you: ? Wash your hands with soap and water before you change your bandage (dressing). If soap and water are not available, use hand sanitizer. ? If you have a dressing, change it or remove it as told by your health care provider. ? Leave stitches (sutures), skin staples, skin glue, or adhesive strips in place. These skin closures may need to stay in place for 2 weeks or longer. If adhesive strip edges start to loosen and curl up, you may trim the loose edges. Do not remove adhesive strips completely unless your health care provider tells you to do that.  Check your incision area every day for signs of infection. Check for: ? More redness, swelling, or pain. ? More fluid or blood. ? Warmth. ? Pus or a bad smell.  Do not take baths, swim, or use a hot tub until your health care provider says it's okay. Ask your health  care provider if you can take showers.  When you cough or sneeze, hug a pillow. This helps with pain and decreases the chance of your incision opening up (dehiscing). Do this until your incision heals. Medicines  Take over-the-counter and prescription medicines only as told by your health care provider.  If you were prescribed an antibiotic medicine, take it as told by your health care provider. Do not stop taking the antibiotic even if you start to feel better.  Do not drive or use heavy machinery while taking prescription pain medicine. Lifestyle  Do not drink alcohol. This is especially important if you are breastfeeding or taking pain medicine.  Do not use any products that contain nicotine or tobacco, such as cigarettes, e-cigarettes, and chewing tobacco. If you need help quitting, ask your health care provider. Eating and drinking  Drink at least 8 eight-ounce glasses of water every day unless told not to by your health care provider. If you breastfeed, you may need to drink even more water.  Eat high-fiber foods every day. These foods may help prevent or relieve constipation. High-fiber foods include: ? Whole grain cereals and breads. ? Brown rice. ? Beans. ? Fresh fruits and vegetables. Activity   If possible, have someone help you care for your baby and help with household activities for at least a few days after you leave the hospital.  Return to your normal activities as told by your health care provider. Ask your health care provider what activities are safe for   you.  Rest as much as possible. Try to rest or take a nap while your baby is sleeping.  Do not lift anything that is heavier than 10 lbs (4.5 kg), or the limit that you were told, until your health care provider says that it is safe.  Talk with your health care provider about when you can engage in sexual activity. This may depend on your: ? Risk of infection. ? How fast you heal. ? Comfort and desire to  engage in sexual activity. General instructions  Do not use tampons or douches until your health care provider approves.  Wear loose, comfortable clothing and a supportive and well-fitting bra.  Keep your perineum clean and dry. Wipe from front to back when you use the toilet.  If you pass a blood clot, save it and call your health care provider to discuss. Do not flush blood clots down the toilet before you get instructions from your health care provider.  Keep all follow-up visits for you and your baby as told by your health care provider. This is important. Contact a health care provider if:  You have: ? A fever. ? Bad-smelling vaginal discharge. ? Pus or a bad smell coming from your incision. ? Difficulty or pain when urinating. ? A sudden increase or decrease in the frequency of your bowel movements. ? More redness, swelling, or pain around your incision. ? More fluid or blood coming from your incision. ? A rash. ? Nausea. ? Little or no interest in activities you used to enjoy. ? Questions about caring for yourself or your baby.  Your incision feels warm to the touch.  Your breasts turn red or become painful or hard.  You feel unusually sad or worried.  You vomit.  You pass a blood clot from your vagina.  You urinate more than usual.  You are dizzy or light-headed. Get help right away if:  You have: ? Pain that does not go away or get better with medicine. ? Chest pain. ? Difficulty breathing. ? Blurred vision or spots in your vision. ? Thoughts about hurting yourself or your baby. ? New pain in your abdomen or in one of your legs. ? A severe headache.  You faint.  You bleed from your vagina so much that you fill more than one sanitary pad in one hour. Bleeding should not be heavier than your heaviest period. Summary  After the procedure, it is common to have pain at your incision site, abdominal cramping, and slight bleeding from your vagina.  Check  your incision area every day for signs of infection.  Tell your health care provider about any unusual symptoms.  Keep all follow-up visits for you and your baby as told by your health care provider. This information is not intended to replace advice given to you by your health care provider. Make sure you discuss any questions you have with your health care provider. Document Released: 11/23/2001 Document Revised: 09/09/2017 Document Reviewed: 09/09/2017 Elsevier Patient Education  2020 [employer].   Postpartum Baby Blues The postpartum period begins right after the birth of a baby. During this time, there is often a lot of joy and excitement. It is also a time of many changes in the life of the parents. No matter how many times a mother gives birth, each child brings new challenges to the family, including different ways of relating to one another. It is common to have feelings of excitement along with confusing changes in moods, emotions,  and thoughts. You may feel happy one minute and sad or stressed the next. These feelings of sadness usually happen in the period right after you have your baby, and they go away within a week or two. This is called the "baby blues." °What are the causes? °There is no known cause of baby blues. It is likely caused by a combination of factors. However, changes in hormone levels after childbirth are believed to trigger some of the symptoms. °Other factors that can play a role in these mood changes include: °· Lack of sleep. °· Stressful life events, such as poverty, caring for a loved one, or death of a loved one. °· Genetics. °What are the signs or symptoms? °Symptoms of this condition include: °· Brief changes in mood, such as going from extreme happiness to sadness. °· Decreased concentration. °· Difficulty sleeping. °· Crying spells and tearfulness. °· Loss of appetite. °· Irritability. °· Anxiety. °If the symptoms of baby blues last for more than 2 weeks or  become more severe, you may have postpartum depression. °How is this diagnosed? °This condition is diagnosed based on an evaluation of your symptoms. There are no medical or lab tests that lead to a diagnosis, but there are various questionnaires that a health care provider may use to identify women with the baby blues or postpartum depression. °How is this treated? °Treatment is not needed for this condition. The baby blues usually go away on their own in 1-2 weeks. Social support is often all that is needed. You will be encouraged to get adequate sleep and rest. °Follow these instructions at home: °Lifestyle ° °  ° °· Get as much rest as you can. Take a nap when the baby sleeps. °· Exercise regularly as told by your health care provider. Some women find yoga and walking to be helpful. °· Eat a balanced and nourishing diet. This includes plenty of fruits and vegetables, whole grains, and lean proteins. °· Do little things that you enjoy. Have a cup of tea, take a bubble bath, read your favorite magazine, or listen to your favorite music. °· Avoid alcohol. °· Ask for help with household chores, cooking, grocery shopping, or running errands. Do not try to do everything yourself. Consider hiring a postpartum doula to help. This is a professional who specializes in providing support to new mothers. °· Try not to make any major life changes during pregnancy or right after giving birth. This can add stress. °General instructions °· Talk to people close to you about how you are feeling. Get support from your partner, family members, friends, or other new moms. You may want to join a support group. °· Find ways to cope with stress. This may include: °? Writing your thoughts and feelings in a journal. °? Spending time outside. °? Spending time with people who make you laugh. °· Try to stay positive in how you think. Think about the things you are grateful for. °· Take over-the-counter and prescription medicines only as told  by your health care provider. °· Let your health care provider know if you have any concerns. °· Keep all postpartum visits as told by your health care provider. This is important. °Contact a health care provider if: °· Your baby blues do not go away after 2 weeks. °Get help right away if: °· You have thoughts of taking your own life (suicidal thoughts). °· You think you may harm the baby or other people. °· You see or hear things that are not   there (hallucinations). °Summary °· After giving birth, you may feel happy one minute and sad or stressed the next. Feelings of sadness that happen right after the baby is born and go away after a week or two are called the "baby blues." °· You can manage the baby blues by getting enough rest, eating a healthy diet, exercising, spending time with supportive people, and finding ways to cope with stress. °· If feelings of sadness and stress last longer than 2 weeks or get in the way of caring for your baby, talk to your health care provider. This may mean you have postpartum depression. °This information is not intended to replace advice given to you by your health care provider. Make sure you discuss any questions you have with your health care provider. °Document Released: 12/06/2003 Document Revised: 06/25/2018 Document Reviewed: 04/29/2016 °Elsevier Patient Education © 2020 Elsevier Inc. ° °

## 2019-03-01 ENCOUNTER — Ambulatory Visit: Payer: No Typology Code available for payment source | Admitting: Advanced Practice Midwife

## 2019-03-17 ENCOUNTER — Other Ambulatory Visit: Payer: Self-pay | Admitting: Obstetrics and Gynecology

## 2019-03-25 ENCOUNTER — Ambulatory Visit (INDEPENDENT_AMBULATORY_CARE_PROVIDER_SITE_OTHER): Payer: No Typology Code available for payment source | Admitting: Family Medicine

## 2019-03-25 ENCOUNTER — Other Ambulatory Visit: Payer: Self-pay

## 2019-03-25 DIAGNOSIS — Z1389 Encounter for screening for other disorder: Secondary | ICD-10-CM | POA: Diagnosis not present

## 2019-03-25 DIAGNOSIS — L304 Erythema intertrigo: Secondary | ICD-10-CM

## 2019-03-25 DIAGNOSIS — R030 Elevated blood-pressure reading, without diagnosis of hypertension: Secondary | ICD-10-CM

## 2019-03-25 MED ORDER — CLOTRIMAZOLE-BETAMETHASONE 1-0.05 % EX CREA: 1.0000 "application " | TOPICAL_CREAM | Freq: Two times a day (BID) | CUTANEOUS | 0 refills | Status: DC

## 2019-03-25 MED ORDER — NORETHIN ACE-ETH ESTRAD-FE 1-20 MG-MCG(24) PO TABS: 1.0000 | ORAL_TABLET | Freq: Every day | ORAL | 3 refills | Status: AC

## 2019-03-25 NOTE — Progress Notes (Signed)
Post Partum Exam  [name] is a 34 y.o. G33P2002 female who presents for a postpartum visit. She is 5 weeks postpartum following a low cervical transverse Cesarean section. I have fully reviewed the prenatal and intrapartum course. The delivery was at 39 gestational weeks.  Anesthesia: spinal. Postpartum course has been uneventful. Baby's course has been uneventful. Baby is feeding by bottle - Enfamil AR. Bleeding thin lochia. Bowel function is normal. Bladder function is normal. Patient is not sexually active. Contraception method is OCP (estrogen/progesterone). Postpartum depression screening:positive score 10  Having some itching and redness on the left side of abdomen.   The following portions of the patient's history were reviewed and updated as appropriate: allergies, current medications, past family history, past medical history, past social history, past surgical history and problem list. Last pap smear done 05/2020and was Normal  Review of Systems Pertinent items are noted in HPI.    Objective:  Blood pressure 140/64, pulse (!) 58, weight 250 lb (113.4 kg), unknown if currently breastfeeding.   General:  alert, cooperative and no distress  Lungs: clear to auscultation bilaterally  Heart:  regular rate and rhythm, S1, S2 normal, no murmur, click, rub or gallop  Abdomen: soft, non-tender; bowel sounds normal; no masses,  no organomegaly. Incision clean, dry, intact. Some intertrigo on the left abdomen.        Assessment:    normal postpartum exam. Intertrigo.   Plan:   1. Contraception: OCP (estrogen/progesterone) 2. BP check in a few weeks 3. Lotrisome for intertrigo 4.  Follow up in: 1 year or as needed.

## 2019-03-28 ENCOUNTER — Ambulatory Visit: Payer: No Typology Code available for payment source | Admitting: Obstetrics & Gynecology

## 2019-07-27 ENCOUNTER — Ambulatory Visit: Payer: No Typology Code available for payment source | Admitting: Nurse Practitioner

## 2019-07-28 ENCOUNTER — Telehealth (INDEPENDENT_AMBULATORY_CARE_PROVIDER_SITE_OTHER): Payer: No Typology Code available for payment source | Admitting: Nurse Practitioner

## 2019-07-28 ENCOUNTER — Encounter: Payer: Self-pay | Admitting: Nurse Practitioner

## 2019-07-28 DIAGNOSIS — M545 Low back pain, unspecified: Secondary | ICD-10-CM

## 2019-07-28 MED ORDER — METHOCARBAMOL 500 MG PO TABS: 500.0000 mg | ORAL_TABLET | Freq: Three times a day (TID) | ORAL | 0 refills | Status: DC | PRN

## 2019-07-28 MED ORDER — ACETAMINOPHEN ER 650 MG PO TBCR: 650.0000 mg | EXTENDED_RELEASE_TABLET | Freq: Three times a day (TID) | ORAL | Status: AC | PRN

## 2019-07-28 NOTE — Patient Instructions (Signed)

## 2019-07-28 NOTE — Progress Notes (Signed)
Virtual Visit via Video Note  I connected with@ on 07/28/19 at 12:30 PM EDT by a video enabled telemedicine application and verified that I am speaking with the correct person using two identifiers.  Location: Patient:Home Provider: Office Participants: patient and provider   I discussed the limitations of evaluation and management by telemedicine and the availability of in person appointments. I also discussed with the patient that there may be a patient responsible charge related to this service. The patient expressed understanding and agreed to proceed.  UK:GURK pain x 1week  History of Present Illness: Unable to provide any vital signs Back Pain This is a new problem. The current episode started in the past 7 days. The problem occurs intermittently. The problem is unchanged. The pain is present in the lumbar spine. The quality of the pain is described as aching and cramping. The pain does not radiate. The pain is moderate. The symptoms are aggravated by bending, lying down and sitting. Pertinent negatives include no abdominal pain, bladder incontinence, bowel incontinence, dysuria, fever, leg pain, numbness, paresis, paresthesias, pelvic pain, perianal numbness, tingling or weakness. Risk factors include poor posture, lack of exercise, obesity and sedentary lifestyle. She has tried NSAIDs (some improvement with tylenol) for the symptoms. The treatment provided no relief.   Observations/Objective: Physical Exam  Constitutional: She is oriented to person, place, and time. No distress.  Pulmonary/Chest: Effort normal.  Musculoskeletal:        General: Tenderness present.     Lumbar back: Tenderness present. No bony tenderness.       Back:  Neurological: She is alert and oriented to person, place, and time.  Skin: No rash noted. No erythema.    Assessment and Plan: Kiara Romero was seen today for back pain.  Diagnoses and all orders for this visit:  Tenderness of left lumbar region -      methocarbamol (ROBAXIN) 500 MG tablet; Take 1 tablet (500 mg total) by mouth every 8 (eight) hours as needed for muscle spasms. -     acetaminophen (TYLENOL 8 HOUR) 650 MG CR tablet; Take 1 tablet (650 mg total) by mouth every 8 (eight) hours as needed for pain.   Follow Up Instructions: See avs   I discussed the assessment and treatment plan with the patient. The patient was provided an opportunity to ask questions and all were answered. The patient agreed with the plan and demonstrated an understanding of the instructions.   The patient was advised to call back or seek an in-person evaluation if the symptoms worsen or if the condition fails to improve as anticipated.   Alysia Penna, NP

## 2020-01-15 ENCOUNTER — Ambulatory Visit
Admission: EM | Admit: 2020-01-15 | Discharge: 2020-01-15 | Disposition: A | Discharge status: Home / Self Care | Payer: No Typology Code available for payment source | Attending: Physician Assistant | Admitting: Physician Assistant

## 2020-01-15 ENCOUNTER — Other Ambulatory Visit: Payer: Self-pay

## 2020-01-15 DIAGNOSIS — Z1152 Encounter for screening for COVID-19: Secondary | ICD-10-CM

## 2020-01-15 DIAGNOSIS — J0140 Acute pansinusitis, unspecified: Secondary | ICD-10-CM

## 2020-01-15 MED ORDER — IPRATROPIUM BROMIDE 0.06 % NA SOLN: 2.0000 | Freq: Four times a day (QID) | NASAL | 0 refills | Status: AC

## 2020-01-15 MED ORDER — FLUTICASONE PROPIONATE 50 MCG/ACT NA SUSP: 2.0000 | Freq: Every day | NASAL | 0 refills | Status: AC

## 2020-01-15 MED ORDER — DOXYCYCLINE HYCLATE 100 MG PO CAPS: 100.0000 mg | ORAL_CAPSULE | Freq: Two times a day (BID) | ORAL | 0 refills | Status: AC

## 2020-01-15 NOTE — ED Triage Notes (Signed)
Pt states she has had a cough and congestion x 2 weeks and has developed a sore throat as well. Pt states she has done multiple otc meds and has not gotten much relief. Pt states she did a televisit 2-3 days ago and was advised it sounded like allergies and was prescribed singular and otc meds and has still gotten little relief. Pt is aox4 and ambulatory.

## 2020-01-15 NOTE — ED Provider Notes (Signed)
[facility]    CSN: [id number] Arrival date & time: 01/15/20  6314      History   Chief Complaint Chief Complaint  Patient presents with  . Cough    x 2 weeks  . Nasal Congestion    x 2 weeks    HPI Kiara Romero is a 34 y.o. female.   34 year old female comes in for 2 week history of URI symptoms. Cough, nasal congestion, sore throat. States can get short of breath due to cough. States talking can cause increase in cough, for which she has trouble suppressing.  Had televisit 2-3 days ago, started on zyrtec and singulair, now with rhinorrhea. Denies fever, chills, body aches. Denies abdominal pain, nausea, vomiting, diarrhea. Denies loss of taste/smell. Never smoker.      Past Medical History:  Diagnosis Date  . Anxiety     Patient Active Problem List   Diagnosis Date Noted  . Anemia associated with acute blood loss 02/18/2019  . Elevated blood pressure reading without diagnosis of hypertension 08/11/2018  . Obesity affecting pregnancy 08/11/2018  . Acute pain of right knee 05/15/2017    Past Surgical History:  Procedure Laterality Date  . CESAREAN SECTION  2005  . CESAREAN SECTION N/A 02/15/2019   Procedure: CESAREAN SECTION;  Surgeon: Hermina Staggers, MD;  Location: [facility];  Service: Obstetrics;  Laterality: N/A;    OB History    Gravida  2   Para  2   Term  2   Preterm      AB      Living  2     SAB      TAB      Ectopic      Multiple  0   Live Births  2            Home Medications    Prior to Admission medications   Medication Sig Start Date End Date Taking? Authorizing Provider  acetaminophen (TYLENOL 8 HOUR) 650 MG CR tablet Take 1 tablet (650 mg total) by mouth every 8 (eight) hours as needed for pain. 07/28/19  Yes Nche, Bonna Gains, NP  Norethindrone Acetate-Ethinyl Estrad-FE (LOESTRIN 24 FE) 1-20 MG-MCG(24) tablet Take 1 tablet by mouth daily. 03/25/19  Yes Levie Heritage, DO  doxycycline (VIBRAMYCIN) 100  MG capsule Take 1 capsule (100 mg total) by mouth 2 (two) times daily. 01/15/20   Cathie Hoops, Royce Stegman V, PA-C  fluticasone (FLONASE) 50 MCG/ACT nasal spray Place 2 sprays into both nostrils daily. 01/15/20   Cathie Hoops, Mylia Pondexter V, PA-C  ipratropium (ATROVENT) 0.06 % nasal spray Place 2 sprays into both nostrils 4 (four) times daily. 01/15/20   Belinda Fisher, PA-C    Family History Family History  Problem Relation Age of Onset  . Diabetes Mother   . Hyperlipidemia Mother   . Hypertension Mother   . Depression Mother   . Hyperlipidemia Father   . Heart disease Father   . Drug abuse Father   . Depression Brother   . Hypertension Brother   . Anxiety disorder Brother     Social History Social History   Tobacco Use  . Smoking status: Never Smoker  . Smokeless tobacco: Never Used  Vaping Use  . Vaping Use: Never used  Substance Use Topics  . Alcohol use: No  . Drug use: No     Allergies   Patient has no known allergies.   Review of Systems Review of Systems  Reason  unable to perform ROS: See HPI as above.     Physical Exam Triage Vital Signs ED Triage Vitals  Enc Vitals Group     BP 01/15/20 0921 132/81     Pulse Rate 01/15/20 0921 97     Resp 01/15/20 0921 17     Temp 01/15/20 0921 98.8 F (37.1 C)     Temp Source 01/15/20 0921 Oral     SpO2 01/15/20 0921 97 %     Weight --      Height --      Head Circumference --      Peak Flow --      Pain Score 01/15/20 0924 5     Pain Loc --      Pain Edu? --      Excl. in GC? --    No data found.  Updated Vital Signs BP 132/81 (BP Location: Right Arm)   Pulse 97   Temp 98.8 F (37.1 C) (Oral)   Resp 17   LMP 01/12/2020 (Exact Date)   SpO2 97%   Physical Exam Constitutional:      General: She is not in acute distress.    Appearance: Normal appearance. She is well-developed. She is not ill-appearing, toxic-appearing or diaphoretic.  HENT:     Head: Normocephalic and atraumatic.     Right Ear: Tympanic membrane, ear canal and  external ear normal. Tympanic membrane is not erythematous or bulging.     Left Ear: Tympanic membrane, ear canal and external ear normal. Tympanic membrane is not erythematous or bulging.     Nose: Congestion and rhinorrhea present.     Right Sinus: No maxillary sinus tenderness or frontal sinus tenderness.     Left Sinus: No maxillary sinus tenderness or frontal sinus tenderness.     Mouth/Throat:     Mouth: Mucous membranes are moist.     Pharynx: Oropharynx is clear. Uvula midline.  Eyes:     Conjunctiva/sclera: Conjunctivae normal.     Pupils: Pupils are equal, round, and reactive to light.  Cardiovascular:     Rate and Rhythm: Normal rate and regular rhythm.  Pulmonary:     Effort: Pulmonary effort is normal. No accessory muscle usage, prolonged expiration, respiratory distress or retractions.     Breath sounds: No decreased air movement or transmitted upper airway sounds. No decreased breath sounds.     Comments: LCTAB Musculoskeletal:     Cervical back: Normal range of motion and neck supple.  Skin:    General: Skin is warm and dry.  Neurological:     Mental Status: She is alert and oriented to person, place, and time.     UC Treatments / Results  Labs (all labs ordered are listed, but only abnormal results are displayed) Labs Reviewed  NOVEL CORONAVIRUS, NAA    EKG   Radiology No results found.  Procedures Procedures (including critical care time)  Medications Ordered in UC Medications - No data to display  Initial Impression / Assessment and Plan / UC Course  I have reviewed the triage vital signs and the nursing notes.  Pertinent labs & imaging results that were available during my care of the patient were reviewed by me and considered in my medical decision making (see chart for details).    COVID PCR test ordered. No alarming signs on exam. LCTAB. doxycycline as directed. Symptomatic treatment discussed.  Push fluids.  Return precautions given.  Patient  expresses understanding and agrees to plan.  Final Clinical  Impressions(s) / UC Diagnoses   Final diagnoses:  Encounter for screening for COVID-19  Acute non-recurrent pansinusitis    ED Prescriptions    Medication Sig Dispense Auth. Provider   ipratropium (ATROVENT) 0.06 % nasal spray Place 2 sprays into both nostrils 4 (four) times daily. 15 mL Wiktoria Hemrick V, PA-C   fluticasone (FLONASE) 50 MCG/ACT nasal spray Place 2 sprays into both nostrils daily. 1 g Haddy Mullinax V, PA-C   doxycycline (VIBRAMYCIN) 100 MG capsule Take 1 capsule (100 mg total) by mouth 2 (two) times daily. 14 capsule Belinda Fisher, PA-C     PDMP not reviewed this encounter.   Belinda Fisher, PA-C 01/15/20 1024

## 2020-01-15 NOTE — Discharge Instructions (Signed)
COVID testing ordered. Doxycycline as directed. Start flonase, atrovent nasal spray for nasal congestion/drainage. You can use over the counter nasal saline rinse such as neti pot for nasal congestion. Keep hydrated, your urine should be clear to pale yellow in color. Tylenol/motrin for fever and pain. Monitor for any worsening of symptoms, chest pain, shortness of breath, wheezing, swelling of the throat, go to the emergency department for further evaluation needed.

## 2020-01-16 LAB — NOVEL CORONAVIRUS, NAA: SARS-CoV-2, NAA: NOT DETECTED

## 2020-01-16 LAB — SARS-COV-2, NAA 2 DAY TAT

## 2020-09-14 ENCOUNTER — Telehealth: Payer: BC Managed Care – PPO | Admitting: Family Medicine

## 2020-09-14 DIAGNOSIS — B9789 Other viral agents as the cause of diseases classified elsewhere: Secondary | ICD-10-CM | POA: Diagnosis not present

## 2020-09-14 DIAGNOSIS — J028 Acute pharyngitis due to other specified organisms: Secondary | ICD-10-CM

## 2020-09-14 NOTE — Progress Notes (Signed)
Ms. Carole Binning are scheduled for a virtual visit with your provider today.    Just as we do with appointments in the office, we must obtain your consent to participate.  Your consent will be active for this visit and any virtual visit you may have with one of our providers in the next 365 days.    If you have a MyChart account, I can also send a copy of this consent to you electronically.  All virtual visits are billed to your insurance company just like a traditional visit in the office.  As this is a virtual visit, video technology does not allow for your provider to perform a traditional examination.  This may limit your provider's ability to fully assess your condition.  If your provider identifies any concerns that need to be evaluated in person or the need to arrange testing such as labs, EKG, etc, we will make arrangements to do so.    Although advances in technology are sophisticated, we cannot ensure that it will always work on either your end or our end.  If the connection with a video visit is poor, we may have to switch to a telephone visit.  With either a video or telephone visit, we are not always able to ensure that we have a secure connection.   I need to obtain your verbal consent now.   Are you willing to proceed with your visit today?   Morg[facility] has provided verbal consent on 09/14/2020 for a virtual visit (video or telephone).   E-Visit for Sore Throat  We are sorry that you are not feeling well.  Here is how we plan to help!  Your symptoms indicate a likely viral infection (Pharyngitis).   Pharyngitis is inflammation in the back of the throat which can cause a sore throat, scratchiness and sometimes difficulty swallowing.   Pharyngitis is typically caused by a respiratory virus and will just run its course.  Please keep in mind that your symptoms could last up to 10 days.  For throat pain, we recommend over the counter oral pain relief medications such as acetaminophen or  aspirin, or anti-inflammatory medications such as ibuprofen or naproxen sodium.  Topical treatments such as oral throat lozenges or sprays may be used as needed.  Avoid close contact with loved ones, especially the very young and elderly.  Remember to wash your hands thoroughly throughout the day as this is the number one way to prevent the spread of infection and wipe down door knobs and counters with disinfectant.  After careful review of your answers, I would not recommend and antibiotic for your condition.  Antibiotics should not be used to treat conditions that we suspect are caused by viruses like the virus that causes the common cold or flu. However, some people can have Strep with atypical symptoms. You may need formal testing in clinic or office to confirm if your symptoms continue or worsen.  Providers prescribe antibiotics to treat infections caused by bacteria. Antibiotics are very powerful in treating bacterial infections when they are used properly.  To maintain their effectiveness, they should be used only when necessary.  Overuse of antibiotics has resulted in the development of super bugs that are resistant to treatment!    Home Care: Only take medications as instructed by your medical team. Do not drink alcohol while taking these medications. A steam or ultrasonic humidifier can help congestion.  You can place a towel over your head and breathe in the steam  from hot water coming from a faucet. Avoid close contacts especially the very young and the elderly. Cover your mouth when you cough or sneeze. Always remember to wash your hands.  Get Help Right Away If: You develop worsening fever or throat pain. You develop a severe head ache or visual changes. Your symptoms persist after you have completed your treatment plan.  Make sure you Understand these instructions. Will watch your condition. Will get help right away if you are not doing well or get worse.   Thank you for  choosing an e-visit.  Your e-visit answers were reviewed by a board certified advanced clinical practitioner to complete your personal care plan. Depending upon the condition, your plan could have included both over the counter or prescription medications.  Please review your pharmacy choice. Make sure the pharmacy is open so you can pick up prescription now. If there is a problem, you may contact your provider through [employer] and have the prescription routed to another pharmacy.  Your safety is important to [place]. If you have drug allergies check your prescription carefully.   For the next 24 hours you can use MyChart to ask questions about today's visit, request a non-urgent call back, or ask for a work or school excuse. You will get an email in the next two days asking about your experience. I hope that your e-visit has been valuable and will speed your recovery.    I provided 5 minutes of non face-to-face time during this encounter for chart review and documentation.    Freddy Finner, NP 09/14/2020  5:18 PM

## 2020-09-16 ENCOUNTER — Encounter: Payer: Self-pay | Admitting: Physician Assistant

## 2020-09-16 ENCOUNTER — Telehealth: Payer: No Typology Code available for payment source | Admitting: Physician Assistant

## 2020-09-16 DIAGNOSIS — Z20822 Contact with and (suspected) exposure to covid-19: Secondary | ICD-10-CM

## 2020-09-16 DIAGNOSIS — R0600 Dyspnea, unspecified: Secondary | ICD-10-CM

## 2020-09-16 DIAGNOSIS — J029 Acute pharyngitis, unspecified: Secondary | ICD-10-CM

## 2020-09-16 DIAGNOSIS — Z1152 Encounter for screening for COVID-19: Secondary | ICD-10-CM

## 2020-09-16 DIAGNOSIS — R059 Cough, unspecified: Secondary | ICD-10-CM

## 2020-09-16 MED ORDER — AMOXICILLIN 500 MG PO CAPS: 500.0000 mg | ORAL_CAPSULE | Freq: Two times a day (BID) | ORAL | 0 refills | Status: AC

## 2020-09-16 MED ORDER — FLUTICASONE PROPIONATE 50 MCG/ACT NA SUSP: 2.0000 | Freq: Every day | NASAL | 6 refills | Status: AC

## 2020-09-16 MED ORDER — CETIRIZINE HCL 10 MG PO TABS: 10.0000 mg | ORAL_TABLET | Freq: Every day | ORAL | 0 refills | Status: AC

## 2020-09-16 MED ORDER — BENZONATATE 100 MG PO CAPS: 100.0000 mg | ORAL_CAPSULE | Freq: Two times a day (BID) | ORAL | 0 refills | Status: AC | PRN

## 2020-09-16 MED ORDER — ALBUTEROL SULFATE HFA 108 (90 BASE) MCG/ACT IN AERS: 2.0000 | INHALATION_SPRAY | Freq: Four times a day (QID) | RESPIRATORY_TRACT | 0 refills | Status: AC | PRN

## 2020-09-16 NOTE — Progress Notes (Signed)
E-Visit for Corona Virus Screening  Your current symptoms could be consistent with the coronavirus.  Many health care providers can now test patients at their office but not all are.  [facility] has multiple testing sites. For information on our COVID testing locations and hours go to [email or web address]  We are enrolling you in our MyChart Home Monitoring for COVID19 . Daily you will receive a questionnaire within the MyChart website. Our COVID 19 response team will be monitoring your responses daily.  Testing Information: The COVID-19 Community Testing sites are testing BY APPOINTMENT ONLY.  You can schedule online at [email or web address]  If you do not have access to a smart phone or computer you may call [phone] for an appointment.   Additional testing sites in the Community:  For [facility] Testing sites in [place]  [email or web address]  For Pop-up testing sites in [place]  [email or web address]  For [email or web address]  For [facility] testing in [place] and [email or web address]  For [facility] testing in [place]   [email or web address]  For  more information about community testing call [phone]   Please quarantine yourself while awaiting your test results. Please stay home for a minimum of 10 days from the first day of illness with improving symptoms and you have had 24 hours of no fever (without the use of Tylenol (Acetaminophen) Motrin (Ibuprofen) or any fever reducing medication).  Also - Do not get tested prior to returning to work because once you have had a positive test the test can stay positive for  more than a month in some cases.   You should wear a mask or cloth face covering over your nose and mouth if you must be around other people or animals, including pets (even at home). Try to stay at least 6 feet away from other people. This will protect the people around you.  Please continue good preventive care measures, including:  frequent hand-washing, avoid touching your face, cover coughs/sneezes, stay out of crowds and keep a 6 foot distance from others.  COVID-19 is a respiratory illness with symptoms that are similar to the flu. Symptoms are typically mild to moderate, but there have been cases of severe illness and death due to the virus.   The following symptoms may appear 2-14 days after exposure: Fever Cough Shortness of breath or difficulty breathing Chills Repeated shaking with chills Muscle pain Headache Sore throat New loss of taste or smell Fatigue Congestion or runny nose Nausea or vomiting Diarrhea  Go to the nearest hospital ED for assessment if fever/cough/breathlessness are severe or illness seems like a threat to life.  It is vitally important that if you feel that you have an infection such as this virus or any other virus that you stay home and away from places where you may spread it to others.  You should avoid contact with people age 15 and older.   You can use medication such as prescription cough medication called Tessalon Perles 100 mg. You may take 1-2 capsules every 8 hours as needed for cough,  prescription inhaler called Albuterol MDI 90 mcg /actuation 2 puffs every 4 hours as needed for shortness of breath, wheezing, cough, and prescription for Fluticasone nasal spray 2 sprays in each nostril one time per day  Due to your persistent worsening sore throat, I have prescribed antibiotic Amoxicillin 500 mg one pill twice daily for 10 days   You may also take acetaminophen (Tylenol) as needed for fever.  Reduce  your risk of any infection by using the same  precautions used for avoiding the common cold or flu:  Wash your hands often with soap and warm water for at least 20 seconds.  If soap and water are not readily available, use an alcohol-based hand sanitizer with at least 60% alcohol.  If coughing or sneezing, cover your mouth and nose by coughing or sneezing into the elbow areas of your shirt or coat, into a tissue or into your sleeve (not your hands). Avoid shaking hands with others and consider head nods or verbal greetings only. Avoid touching your eyes, nose, or mouth with unwashed hands.  Avoid close contact with people who are sick. Avoid places or events with large numbers of people in one location, like concerts or sporting events. Carefully consider travel plans you have or are making. If you are planning any travel outside or inside the [place], visit the CDC's Travelers' Health webpage for the latest health notices. If you have some symptoms but not all symptoms, continue to monitor at home and seek medical attention if your symptoms worsen. If you are having a medical emergency, call 911.  HOME CARE Only take medications as instructed by your medical team. Drink plenty of fluids and get plenty of rest. A steam or ultrasonic humidifier can help if you have congestion.   GET HELP RIGHT AWAY IF YOU HAVE EMERGENCY WARNING SIGNS** FOR COVID-19. If you or someone is showing any of these signs seek emergency medical care immediately. Call 911 or proceed to your closest emergency facility if: You develop worsening high fever. Trouble breathing Bluish lips or face Persistent pain or pressure in the chest New confusion Inability to wake or stay awake You cough up blood. Your symptoms become more severe  **This list is not all possible symptoms. Contact your medical provider for any symptoms that are sever or concerning to you.  MAKE SURE YOU  Understand these instructions. Will watch your condition. Will get help right away if you are  not doing well or get worse.  Your e-visit answers were reviewed by a board certified advanced clinical practitioner to complete your personal care plan.  Depending on the condition, your plan could have included both over the counter or prescription medications.  If there is a problem please reply once you have received a response from your provider.  Your safety is important to [place].  If you have drug allergies check your prescription carefully.    You can use MyChart to ask questions about today's visit, request a non-urgent call back, or ask for a work or school excuse for 24 hours related to this e-Visit. If it has been greater than 24 hours you will need to follow up with your provider, or enter a new e-Visit to address those concerns. You will get an e-mail in the next two days asking about your experience.  I hope that your e-visit has been valuable and will speed your recovery. Thank you for using e-visits.  I spent 5-10 minutes on review and completion of this note- [facility]

## 2020-09-16 NOTE — Addendum Note (Signed)
Addended by: Demetrio Lapping on: 09/16/2020 08:29 AM   Modules accepted: Orders

## 2020-11-21 DIAGNOSIS — F419 Anxiety disorder, unspecified: Secondary | ICD-10-CM | POA: Diagnosis not present

## 2020-11-22 ENCOUNTER — Ambulatory Visit: Payer: BC Managed Care – PPO | Admitting: Nurse Practitioner

## 2021-05-28 DIAGNOSIS — R102 Pelvic and perineal pain: Secondary | ICD-10-CM | POA: Diagnosis not present

## 2021-05-28 DIAGNOSIS — D259 Leiomyoma of uterus, unspecified: Secondary | ICD-10-CM | POA: Diagnosis not present

## 2021-05-28 DIAGNOSIS — R3915 Urgency of urination: Secondary | ICD-10-CM | POA: Diagnosis not present

## 2021-05-28 DIAGNOSIS — R1934 Left lower quadrant abdominal rigidity: Secondary | ICD-10-CM | POA: Diagnosis not present

## 2021-05-28 DIAGNOSIS — R109 Unspecified abdominal pain: Secondary | ICD-10-CM | POA: Diagnosis not present

## 2021-05-29 ENCOUNTER — Other Ambulatory Visit (HOSPITAL_BASED_OUTPATIENT_CLINIC_OR_DEPARTMENT_OTHER): Payer: Self-pay | Admitting: Family Medicine

## 2021-05-29 DIAGNOSIS — R102 Pelvic and perineal pain unspecified side: Secondary | ICD-10-CM

## 2021-05-29 DIAGNOSIS — R10A0 Flank pain, unspecified side: Secondary | ICD-10-CM

## 2021-05-29 DIAGNOSIS — R109 Unspecified abdominal pain: Secondary | ICD-10-CM

## 2021-05-29 DIAGNOSIS — R198 Other specified symptoms and signs involving the digestive system and abdomen: Secondary | ICD-10-CM

## 2021-06-03 ENCOUNTER — Encounter (HOSPITAL_BASED_OUTPATIENT_CLINIC_OR_DEPARTMENT_OTHER): Payer: Self-pay

## 2021-06-03 ENCOUNTER — Other Ambulatory Visit: Payer: Self-pay

## 2021-06-03 ENCOUNTER — Ambulatory Visit (HOSPITAL_BASED_OUTPATIENT_CLINIC_OR_DEPARTMENT_OTHER)
Admission: RE | Admit: 2021-06-03 | Discharge: 2021-06-03 | Disposition: A | Discharge status: Home / Self Care | Payer: BC Managed Care – PPO | Source: Ambulatory Visit | Attending: Family Medicine | Admitting: Family Medicine

## 2021-06-03 DIAGNOSIS — R102 Pelvic and perineal pain unspecified side: Secondary | ICD-10-CM

## 2021-06-03 DIAGNOSIS — R1909 Other intra-abdominal and pelvic swelling, mass and lump: Secondary | ICD-10-CM | POA: Diagnosis not present

## 2021-06-03 DIAGNOSIS — R10A0 Flank pain, unspecified side: Secondary | ICD-10-CM

## 2021-06-03 DIAGNOSIS — R109 Unspecified abdominal pain: Secondary | ICD-10-CM | POA: Diagnosis not present

## 2021-06-03 DIAGNOSIS — R198 Other specified symptoms and signs involving the digestive system and abdomen: Secondary | ICD-10-CM | POA: Diagnosis not present

## 2021-06-03 MED ORDER — IOHEXOL 300 MG/ML  SOLN
100.0000 mL | Freq: Once | INTRAMUSCULAR | Status: AC | PRN
  Administered 2021-06-03: 100 mL via INTRAVENOUS

## 2021-06-04 DIAGNOSIS — R109 Unspecified abdominal pain: Secondary | ICD-10-CM | POA: Diagnosis not present

## 2021-06-04 DIAGNOSIS — R1904 Left lower quadrant abdominal swelling, mass and lump: Secondary | ICD-10-CM | POA: Diagnosis not present

## 2021-06-04 DIAGNOSIS — R935 Abnormal findings on diagnostic imaging of other abdominal regions, including retroperitoneum: Secondary | ICD-10-CM | POA: Diagnosis not present

## 2021-06-12 DIAGNOSIS — Z01419 Encounter for gynecological examination (general) (routine) without abnormal findings: Secondary | ICD-10-CM | POA: Diagnosis not present

## 2021-06-12 DIAGNOSIS — N83202 Unspecified ovarian cyst, left side: Secondary | ICD-10-CM | POA: Diagnosis not present

## 2021-06-12 DIAGNOSIS — E669 Obesity, unspecified: Secondary | ICD-10-CM | POA: Diagnosis not present

## 2021-06-12 DIAGNOSIS — N838 Other noninflammatory disorders of ovary, fallopian tube and broad ligament: Secondary | ICD-10-CM | POA: Diagnosis not present

## 2021-06-12 DIAGNOSIS — Z01411 Encounter for gynecological examination (general) (routine) with abnormal findings: Secondary | ICD-10-CM | POA: Diagnosis not present

## 2021-06-12 DIAGNOSIS — Z1151 Encounter for screening for human papillomavirus (HPV): Secondary | ICD-10-CM | POA: Diagnosis not present

## 2021-06-12 DIAGNOSIS — Z6841 Body Mass Index (BMI) 40.0 and over, adult: Secondary | ICD-10-CM | POA: Diagnosis not present

## 2021-07-12 DIAGNOSIS — R1900 Intra-abdominal and pelvic swelling, mass and lump, unspecified site: Secondary | ICD-10-CM | POA: Diagnosis not present

## 2021-07-25 DIAGNOSIS — R1900 Intra-abdominal and pelvic swelling, mass and lump, unspecified site: Secondary | ICD-10-CM | POA: Diagnosis not present

## 2021-07-25 DIAGNOSIS — Z01810 Encounter for preprocedural cardiovascular examination: Secondary | ICD-10-CM | POA: Diagnosis not present

## 2021-07-25 DIAGNOSIS — Z01818 Encounter for other preprocedural examination: Secondary | ICD-10-CM | POA: Diagnosis not present

## 2021-07-30 DIAGNOSIS — Z8249 Family history of ischemic heart disease and other diseases of the circulatory system: Secondary | ICD-10-CM | POA: Diagnosis not present

## 2021-07-30 DIAGNOSIS — N839 Noninflammatory disorder of ovary, fallopian tube and broad ligament, unspecified: Secondary | ICD-10-CM | POA: Diagnosis not present

## 2021-07-30 DIAGNOSIS — Z833 Family history of diabetes mellitus: Secondary | ICD-10-CM | POA: Diagnosis not present

## 2021-07-30 DIAGNOSIS — Z6841 Body Mass Index (BMI) 40.0 and over, adult: Secondary | ICD-10-CM | POA: Diagnosis not present

## 2021-07-30 DIAGNOSIS — D271 Benign neoplasm of left ovary: Secondary | ICD-10-CM | POA: Diagnosis not present

## 2021-10-11 DIAGNOSIS — Z30011 Encounter for initial prescription of contraceptive pills: Secondary | ICD-10-CM | POA: Diagnosis not present

## 2022-04-01 DIAGNOSIS — F419 Anxiety disorder, unspecified: Secondary | ICD-10-CM | POA: Diagnosis not present

## 2022-04-01 DIAGNOSIS — N83202 Unspecified ovarian cyst, left side: Secondary | ICD-10-CM | POA: Diagnosis not present

## 2022-04-01 DIAGNOSIS — E669 Obesity, unspecified: Secondary | ICD-10-CM | POA: Diagnosis not present

## 2022-04-01 DIAGNOSIS — N938 Other specified abnormal uterine and vaginal bleeding: Secondary | ICD-10-CM | POA: Diagnosis not present

## 2023-12-30 ENCOUNTER — Other Ambulatory Visit: Payer: Self-pay

## 2023-12-30 ENCOUNTER — Encounter (HOSPITAL_BASED_OUTPATIENT_CLINIC_OR_DEPARTMENT_OTHER): Payer: Self-pay

## 2023-12-30 ENCOUNTER — Emergency Department (HOSPITAL_BASED_OUTPATIENT_CLINIC_OR_DEPARTMENT_OTHER)
Admission: EM | Admit: 2023-12-30 | Discharge: 2023-12-30 | Disposition: A | Discharge status: Home / Self Care | Attending: Emergency Medicine | Admitting: Emergency Medicine

## 2023-12-30 DIAGNOSIS — R42 Dizziness and giddiness: Secondary | ICD-10-CM | POA: Insufficient documentation

## 2023-12-30 LAB — COMPREHENSIVE METABOLIC PANEL WITH GFR
ALT: 14 U/L (ref 0–44)
AST: 15 U/L (ref 15–41)
Albumin: 4.1 g/dL (ref 3.5–5.0)
Alkaline Phosphatase: 65 U/L (ref 38–126)
Anion gap: 12 (ref 5–15)
BUN: 10 mg/dL (ref 6–20)
CO2: 22 mmol/L (ref 22–32)
Calcium: 9.4 mg/dL (ref 8.9–10.3)
Chloride: 103 mmol/L (ref 98–111)
Creatinine, Ser: 0.84 mg/dL (ref 0.44–1.00)
GFR, Estimated: >60 mL/min (ref >60)
Glucose, Bld: 115 mg/dL — ABNORMAL HIGH (ref 70–99)
Potassium: 4.1 mmol/L (ref 3.5–5.1)
Sodium: 137 mmol/L (ref 135–145)
Total Bilirubin: 0.3 mg/dL (ref 0.0–1.2)
Total Protein: 7.5 g/dL (ref 6.5–8.1)

## 2023-12-30 LAB — URINALYSIS, ROUTINE W REFLEX MICROSCOPIC
Bilirubin Urine: NEGATIVE
Glucose, UA: NEGATIVE mg/dL
Ketones, ur: NEGATIVE mg/dL
Leukocytes,Ua: NEGATIVE
Nitrite: NEGATIVE
Protein, ur: NEGATIVE mg/dL
Specific Gravity, Urine: 1.015 (ref 1.005–1.030)
pH: 6 (ref 5.0–8.0)

## 2023-12-30 LAB — CBC WITH DIFFERENTIAL/PLATELET
Abs Immature Granulocytes: 0.04 K/uL (ref 0.00–0.07)
Basophils Absolute: 0 K/uL (ref 0.0–0.1)
Basophils Relative: 0 %
Eosinophils Absolute: 0.1 K/uL (ref 0.0–0.5)
Eosinophils Relative: 1 %
HCT: 39.4 % (ref 36.0–46.0)
Hemoglobin: 13.1 g/dL (ref 12.0–15.0)
Immature Granulocytes: 1 %
Lymphocytes Relative: 46 %
Lymphs Abs: 3.6 K/uL (ref 0.7–4.0)
MCH: 29.4 pg (ref 26.0–34.0)
MCHC: 33.2 g/dL (ref 30.0–36.0)
MCV: 88.3 fL (ref 80.0–100.0)
Monocytes Absolute: 0.6 K/uL (ref 0.1–1.0)
Monocytes Relative: 8 %
Neutro Abs: 3.3 K/uL (ref 1.7–7.7)
Neutrophils Relative %: 44 %
Platelets: 367 K/uL (ref 150–400)
RBC: 4.46 MIL/uL (ref 3.87–5.11)
RDW: 14 % (ref 11.5–15.5)
WBC: 7.6 K/uL (ref 4.0–10.5)
nRBC: 0 % (ref 0.0–0.2)

## 2023-12-30 LAB — URINALYSIS, MICROSCOPIC (REFLEX)

## 2023-12-30 LAB — PREGNANCY, URINE: Preg Test, Ur: NEGATIVE

## 2023-12-30 MED ORDER — MECLIZINE HCL 25 MG PO TABS: 25.0000 mg | ORAL_TABLET | Freq: Three times a day (TID) | ORAL | 0 refills | Status: AC | PRN

## 2023-12-30 MED ORDER — MECLIZINE HCL 25 MG PO TABS
25.0000 mg | ORAL_TABLET | Freq: Once | ORAL | Status: AC
  Administered 2023-12-30: 25 mg via ORAL
  Filled 2023-12-30: qty 1

## 2023-12-30 MED ORDER — IBUPROFEN 400 MG PO TABS
600.0000 mg | ORAL_TABLET | Freq: Once | ORAL | Status: AC
  Administered 2023-12-30: 600 mg via ORAL
  Filled 2023-12-30: qty 1

## 2023-12-30 NOTE — ED Notes (Signed)
 Pt. Reports she has been feeling dizzy for the pas week if she looks down or moves her head side to side  and tonight while walking and moving her head she is really dizzy.  Pt. Feels the need to hold onto something to be stable.    Pt. Feels a tightness in the back of her neck that goes along with the dizzness.  The neck pain started just this week.

## 2023-12-30 NOTE — ED Triage Notes (Signed)
 Pt reports dizziness and neck pain last week.  Feels off balanced

## 2023-12-30 NOTE — Discharge Instructions (Signed)
 You were seen in the Emergency Department for dizziness Your blood work EKG looked okay Urine test was negative for UTI Your pregnancy test was negative The most likely cause of your dizziness would be vertigo You can try using meclizine at home when you feel dizzy Take ibuprofen  as directed for neck pain Follow-up with your primary doctor Return to the emergency room for severe dizziness, if you are unable to walk, have severe neck pain or any other concerns

## 2023-12-30 NOTE — ED Provider Notes (Signed)
 [facility] Provider Note   CSN: [id number] Arrival date & time: 12/30/23  2132     Patient presents with: Neck Pain and Dizziness   Kiara Romero is a 38 y.o. female.  Who presents to the ED for dizziness.  Patient reports ongoing dizziness over the last 3 to 4 days made worse with looking down or turning her head to the left.  Also reporting some left-sided neck pain.  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  No numbness tingling or weakness in the upper extremities.  No falls or recent trauma.  No prior history of dizzy episodes but she does note her mother has a history of vertigo.  No fevers chills recent respiratory infections    Neck Pain Dizziness      Prior to Admission medications   Medication Sig Start Date End Date Taking? Authorizing Provider  meclizine (ANTIVERT) 25 MG tablet Take 1 tablet (25 mg total) by mouth 3 (three) times daily as needed for dizziness. 12/30/23  Yes Pamella Ozell LABOR, DO  acetaminophen  (TYLENOL  8 HOUR) 650 MG CR tablet Take 1 tablet (650 mg total) by mouth every 8 (eight) hours as needed for pain. 07/28/19   Nche, Roselie Rockford, NP  albuterol  (VENTOLIN  HFA) 108 (90 Base) MCG/ACT inhaler Inhale 2 puffs into the lungs every 6 (six) hours as needed for wheezing or shortness of breath. 09/16/20   Elvie French CHRISTELLA, PA-C  benzonatate  (TESSALON ) 100 MG capsule Take 1 capsule (100 mg total) by mouth 2 (two) times daily as needed for cough. 09/16/20   Elvie French CHRISTELLA, PA-C  cetirizine  (ZYRTEC ) 10 MG tablet Take 1 tablet (10 mg total) by mouth daily. 09/16/20   Elvie French CHRISTELLA, PA-C  doxycycline  (VIBRAMYCIN ) 100 MG capsule Take 1 capsule (100 mg total) by mouth 2 (two) times daily. 01/15/20   Babara, Amy V, PA-C  fluticasone  (FLONASE ) 50 MCG/ACT nasal spray Place 2 sprays into both nostrils daily. 01/15/20   Yu, Amy V, PA-C  fluticasone  (FLONASE ) 50 MCG/ACT nasal spray Place 2  sprays into both nostrils daily. 09/16/20   Elvie French CHRISTELLA, PA-C  ipratropium (ATROVENT ) 0.06 % nasal spray Place 2 sprays into both nostrils 4 (four) times daily. 01/15/20   Babara, Amy V, PA-C  Norethindrone Acetate-Ethinyl Estrad-FE (LOESTRIN  24 FE) 1-20 MG-MCG(24) tablet Take 1 tablet by mouth daily. 03/25/19   Stinson, Jacob J, DO    Allergies: Patient has no known allergies.    Review of Systems  Musculoskeletal:  Positive for neck pain.  Neurological:  Positive for dizziness.    Updated Vital Signs BP 135/82   Pulse 79   Temp 98.2 F (36.8 C) (Oral)   Resp 16   Ht 5' 3 (1.6 m)   Wt 122.5 kg   LMP 12/05/2023 (Exact Date)   SpO2 97%   BMI 47.83 kg/m   Physical Exam Vitals and nursing note reviewed.  HENT:     Head: Normocephalic and atraumatic.  Eyes:     Extraocular Movements: Extraocular movements intact.     Pupils: Pupils are equal, round, and reactive to light.  Cardiovascular:     Rate and Rhythm: Normal rate and regular rhythm.  Pulmonary:     Effort: Pulmonary effort is normal.     Breath sounds: Normal breath sounds.  Abdominal:     Palpations: Abdomen is soft.     Tenderness: There  is no abdominal tenderness.  Musculoskeletal:     Cervical back: Normal range of motion and neck supple. No rigidity or tenderness.  Skin:    General: Skin is warm and dry.  Neurological:     General: No focal deficit present.     Mental Status: She is alert.     Sensory: No sensory deficit.     Motor: No weakness.  Psychiatric:        Mood and Affect: Mood normal.     (all labs ordered are listed, but only abnormal results are displayed) Labs Reviewed  COMPREHENSIVE METABOLIC PANEL WITH GFR - Abnormal; Notable for the following components:      Result Value   Glucose, Bld 115 (*)    All other components within normal limits  URINALYSIS, ROUTINE W REFLEX MICROSCOPIC - Abnormal; Notable for the following components:   Hgb urine dipstick TRACE (*)    All other components  within normal limits  URINALYSIS, MICROSCOPIC (REFLEX) - Abnormal; Notable for the following components:   Bacteria, UA RARE (*)    All other components within normal limits  CBC WITH DIFFERENTIAL/PLATELET  PREGNANCY, URINE    EKG: EKG Interpretation Date/Time:  Wednesday December 30 2023 22:33:21 EDT Ventricular Rate:  83 PR Interval:  158 QRS Duration:  77 QT Interval:  358 QTC Calculation: 421 R Axis:   50  Text Interpretation: Sinus rhythm Confirmed by Pamella Sharper [phone]) on 12/30/2023 11:11:19 PM  Radiology: No results found.   Procedures   Medications Ordered in the ED  ibuprofen  (ADVIL ) tablet 600 mg (600 mg Oral Given 12/30/23 2323)  meclizine (ANTIVERT) tablet 25 mg (25 mg Oral Given 12/30/23 2323)                                    Medical Decision Making 38 year old female with history as above presenting to ED for left-sided neck discomfort as well as dizziness.  Dizziness feels like room spinning made worse with looking down quickly or moving her head to the left.  No neurologic deficits.  No midline tenderness no rigidity no fevers chills.  No need for imaging at this time.  Pregnancy negative urine negative for UTI no significant abnormalities on CBC CMP.  Low suspicion for acute infection.  This is most likely peripheral vertigo.  Will trial meclizine.  Return precautions that would be worrisome for central cause or infectious cause of neck pain are discussed with the patient and her husband in detail.  Instructed on symptomatic management need for PCP follow-up.  Amount and/or Complexity of Data Reviewed Labs: ordered.        Final diagnoses:  Vertigo    ED Discharge Orders          Ordered    meclizine (ANTIVERT) 25 MG tablet  3 times daily PRN        12/30/23 2329               Pamella Sharper LABOR, DO 12/30/23 2345

## 2024-02-16 IMAGING — CT CT ABD-PEL WO/W CM
2 of 8 series · 12 of 46 positions shown, 18 images · IV contrast (agent unspecified)
Comparison: Ultrasound February 04, 2019

CLINICAL DATA: Increasing frequency of left-sided abdominal pain.

EXAM:
CT ABDOMEN AND PELVIS WITHOUT AND WITH CONTRAST
TECHNIQUE: Multidetector CT imaging of the abdomen and pelvis was performed
following the standard protocol before and following the bolus
administration of intravenous contrast.

[Series 2: axial st · axial · 0.98mm/px · z∈[-456,-60]mm · 9 of 97 slices shown, 15 images]
[im 9/97  soft-tissue]
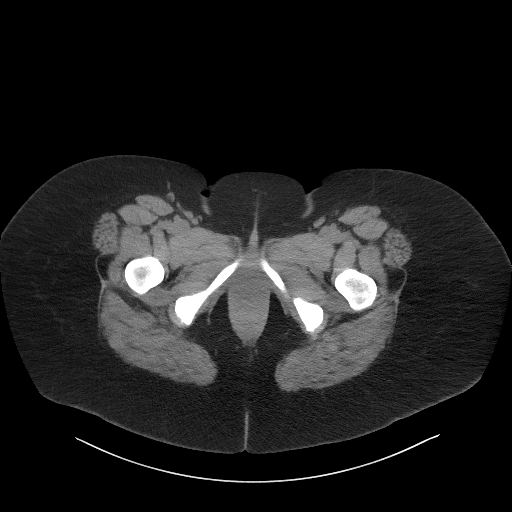
[im 9/97  bone]
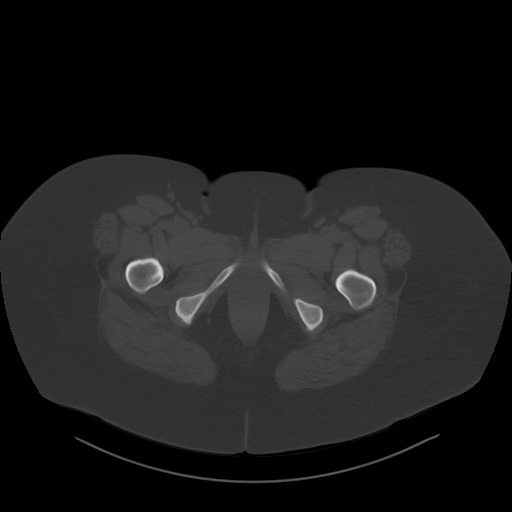
[im 18/97  soft-tissue]
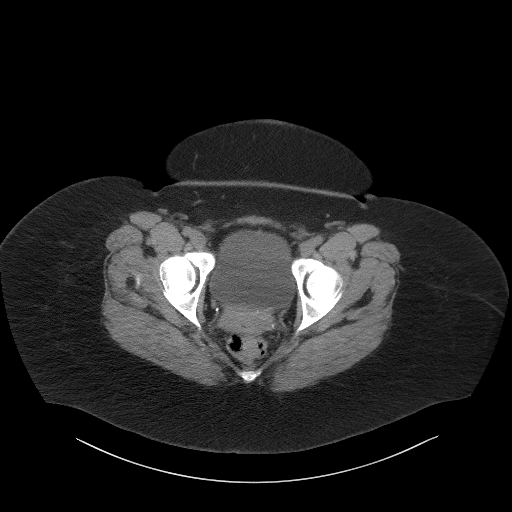
[im 27/97  soft-tissue]
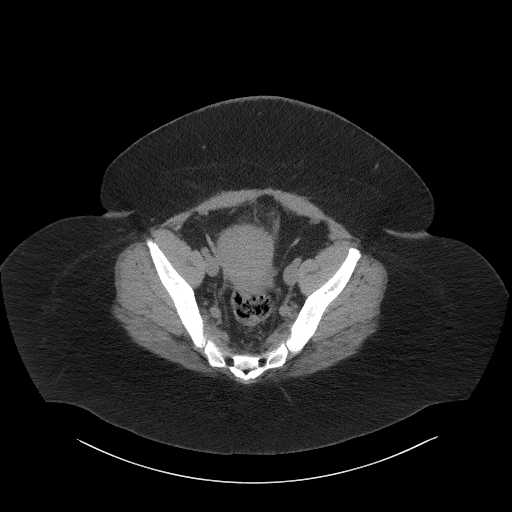
[im 35/97  soft-tissue]
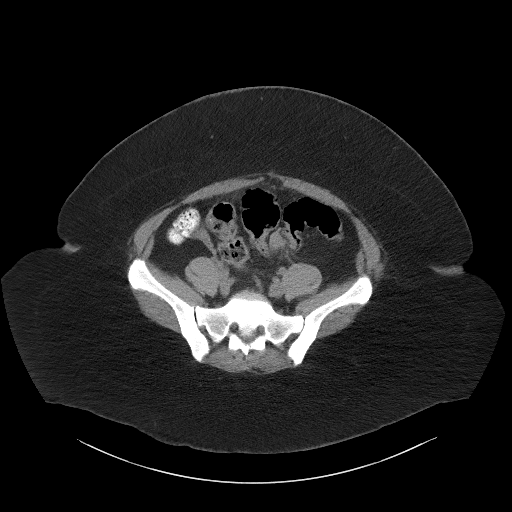
[im 53/97  soft-tissue]
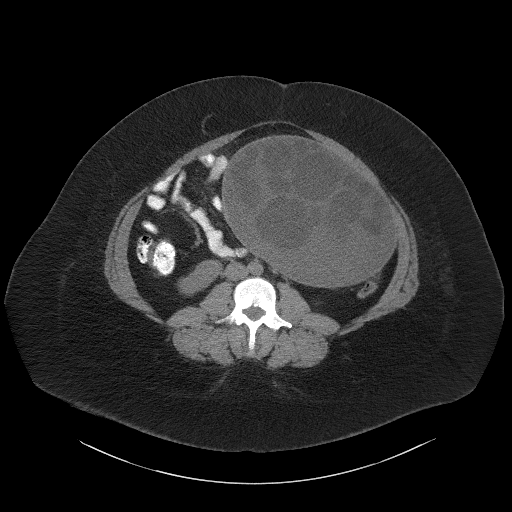
[im 62/97  soft-tissue]
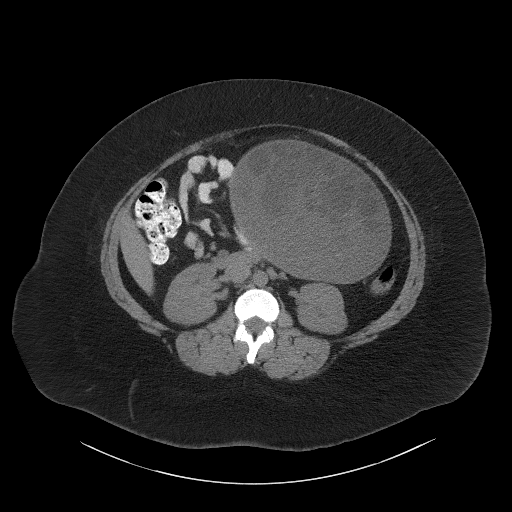
[im 62/97  lung]
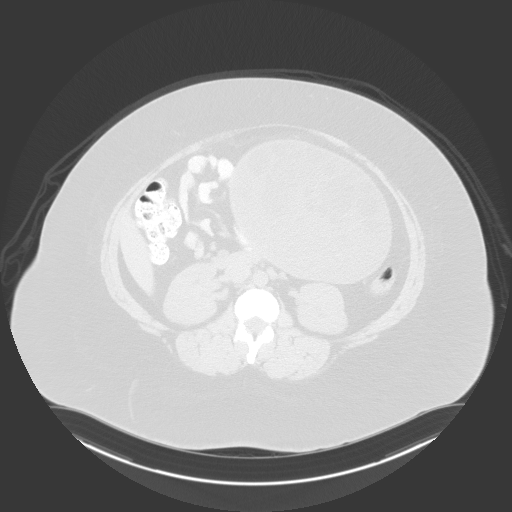
[im 70/97  soft-tissue]
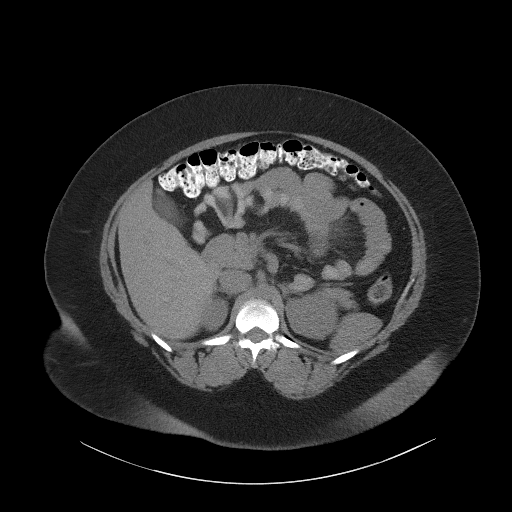
[im 70/97  lung]
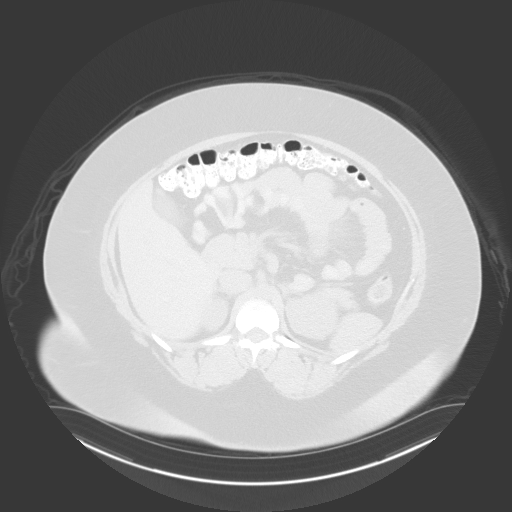
[im 79/97  soft-tissue]
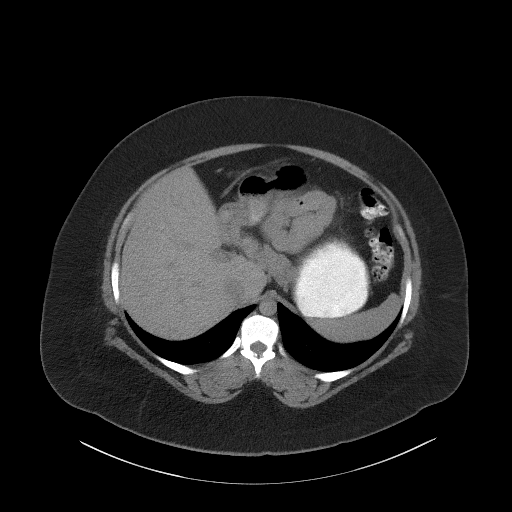
[im 79/97  lung]
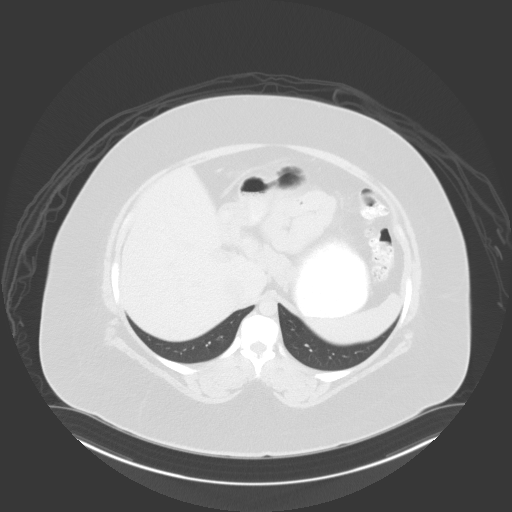
[im 88/97  soft-tissue]
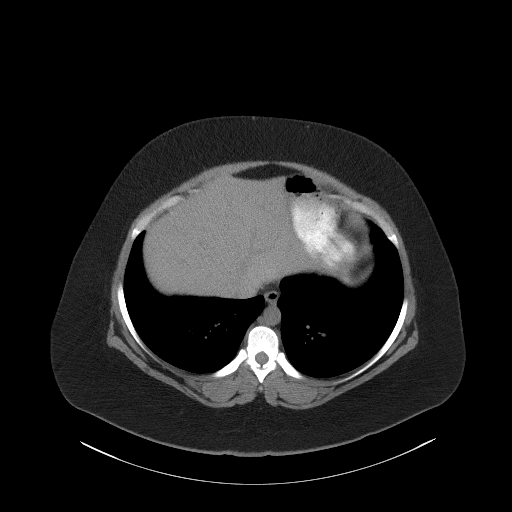
[im 88/97  lung]
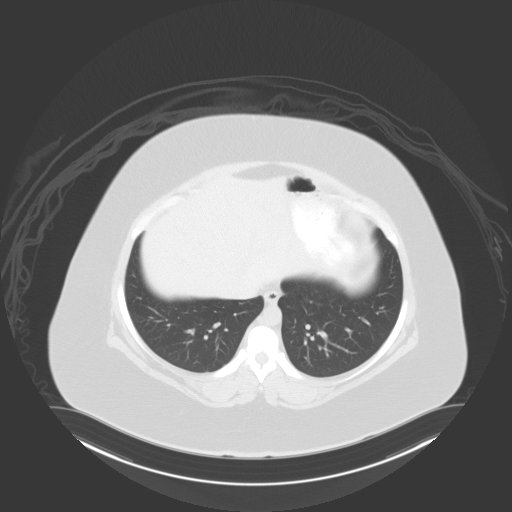
[im 88/97  bone]
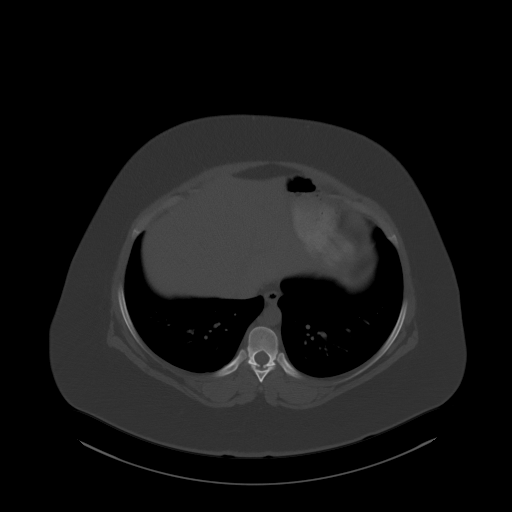

[Series 6: coronal st · coronal · 0.96mm/px · 3 of 123 slices shown]
[im 31/123  soft-tissue]
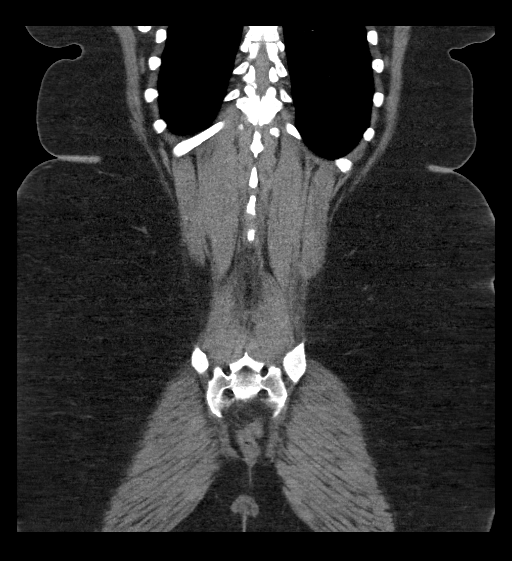
[im 62/123  soft-tissue]
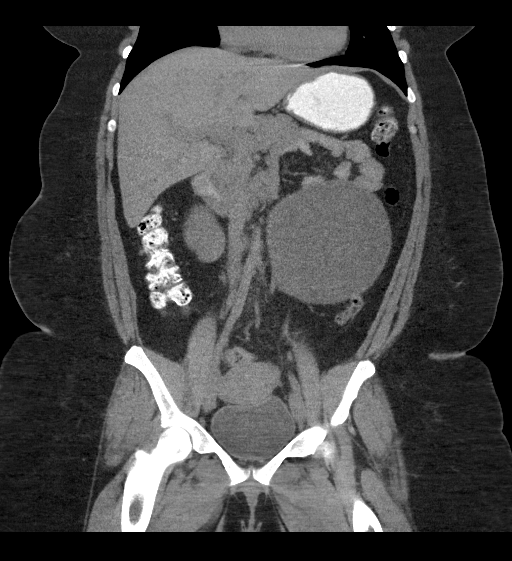
[im 92/123  soft-tissue]
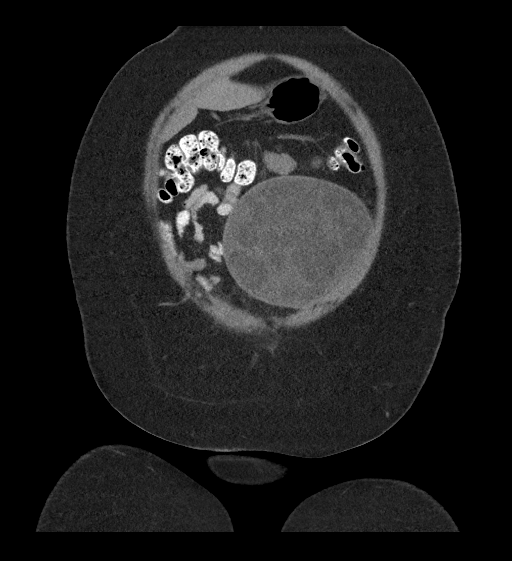

[12 of 46 positions shown; findings below may reference images not displayed]

RADIATION DOSE REDUCTION: This exam was performed according to the
departmental dose-optimization program which includes automated
exposure control, adjustment of the mA and/or kV according to
patient size and/or use of iterative reconstruction technique.

CONTRAST:  100mL OMNIPAQUE IOHEXOL 300 MG/ML  SOLN
FINDINGS: Lower chest: No acute abnormality.

Hepatobiliary: No suspicious hepatic lesion. Gallbladder is
unremarkable. No biliary ductal dilation.

Pancreas: No pancreatic ductal dilation or evidence of acute
inflammation.

Spleen: No splenomegaly or focal splenic lesion.

Adrenals/Urinary Tract: Bilateral adrenal glands appear normal. No
hydronephrosis. Kidneys demonstrate symmetric enhancement. No solid
enhancing renal mass. Urinary bladder is unremarkable for degree of
distension.

Stomach/Bowel: Radiopaque enteric contrast material traverses the
hepatic flexure. Stomach is unremarkable for degree of distension.
No pathologic dilation of small or large bowel. The terminal ileum
appears normal. The appendix is not confidently identified however
there is no pericecal inflammation. No evidence of acute bowel
inflammation.

Vascular/Lymphatic: Normal caliber abdominal aorta. No
pathologically enlarged abdominal or pelvic lymph nodes.

Reproductive: I the left ovary is not identified. Heterogeneous
x 12.8 x 14.8 cm mass which appears to be in continuity with the
left broad ligament and is overall fluid density with numerous
internal low-density lobules and a focus of dystrophic calcification
on image 50/3, most consistent with a mature cystic teratoma.

The right ovary and uterus are unremarkable.

Other: No significant abdominopelvic free fluid.

Musculoskeletal: No acute osseous abnormality.
IMPRESSION: Heterogeneous 18.6 x 12.8 x 14.8 cm mass arising from the left broad
ligament which is overall fluid density with numerous internal
hypodense lobules and a focus of dystrophic calcification, most
consistent with a mature cystic teratoma. Recommend gynecologic
consult.

These results will be called to the ordering clinician or
representative by the Radiologist Assistant, and communication
documented in the PACS or [REDACTED].
